# Patient Record
Sex: Female | Born: 1970 | Race: White | Hispanic: No | Marital: Single | Smoking: Never smoker
Health system: Southern US, Community
[De-identification: ages and names within clinical notes are randomized; demographics above are authoritative.]

## PROBLEM LIST (undated history)

## (undated) DIAGNOSIS — J45909 Unspecified asthma, uncomplicated: Secondary | ICD-10-CM

## (undated) DIAGNOSIS — G43909 Migraine, unspecified, not intractable, without status migrainosus: Secondary | ICD-10-CM

## (undated) HISTORY — PX: CERVICAL FUSION: SHX112

## (undated) HISTORY — PX: BREAST SURGERY: SHX581

---

## 2008-03-29 ENCOUNTER — Ambulatory Visit: Payer: Self-pay | Admitting: Family Medicine

## 2008-03-29 DIAGNOSIS — J209 Acute bronchitis, unspecified: Secondary | ICD-10-CM

## 2008-03-29 DIAGNOSIS — J45901 Unspecified asthma with (acute) exacerbation: Secondary | ICD-10-CM | POA: Insufficient documentation

## 2017-11-07 ENCOUNTER — Other Ambulatory Visit: Payer: Self-pay

## 2017-11-07 ENCOUNTER — Emergency Department (INDEPENDENT_AMBULATORY_CARE_PROVIDER_SITE_OTHER): Payer: 59

## 2017-11-07 ENCOUNTER — Emergency Department (INDEPENDENT_AMBULATORY_CARE_PROVIDER_SITE_OTHER)
Admission: EM | Admit: 2017-11-07 | Discharge: 2017-11-07 | Disposition: A | Discharge status: Home / Self Care | Payer: Self-pay | Source: Home / Self Care | Attending: Family Medicine | Admitting: Family Medicine

## 2017-11-07 DIAGNOSIS — Z981 Arthrodesis status: Secondary | ICD-10-CM

## 2017-11-07 DIAGNOSIS — M542 Cervicalgia: Secondary | ICD-10-CM | POA: Diagnosis not present

## 2017-11-07 DIAGNOSIS — G8911 Acute pain due to trauma: Secondary | ICD-10-CM

## 2017-11-07 DIAGNOSIS — S139XXA Sprain of joints and ligaments of unspecified parts of neck, initial encounter: Secondary | ICD-10-CM

## 2017-11-07 HISTORY — DX: Migraine, unspecified, not intractable, without status migrainosus: G43.909

## 2017-11-07 HISTORY — DX: Unspecified asthma, uncomplicated: J45.909

## 2017-11-07 MED ORDER — KETOROLAC TROMETHAMINE 60 MG/2ML IM SOLN
60.0000 mg | Freq: Once | INTRAMUSCULAR | Status: AC
  Administered 2017-11-07: 60 mg via INTRAMUSCULAR

## 2017-11-07 MED ORDER — HYDROCODONE-ACETAMINOPHEN 5-325 MG PO TABS: 1.0000 | ORAL_TABLET | Freq: Four times a day (QID) | ORAL | 0 refills | Status: AC | PRN

## 2017-11-07 NOTE — Discharge Instructions (Signed)
Apply ice pack for 20 to 30 minutes, 3 to 4 times daily  Continue until pain and swelling decrease. May continue Ibuprofen 200mg , 4 tabs every 8 hours with food.  Begin range of motion and stretching exercises in about five days.
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
## 2018-12-06 DIAGNOSIS — E7800 Pure hypercholesterolemia, unspecified: Secondary | ICD-10-CM | POA: Diagnosis not present

## 2018-12-06 DIAGNOSIS — Z0000 Encounter for general adult medical examination without abnormal findings: Secondary | ICD-10-CM | POA: Diagnosis not present

## 2018-12-06 DIAGNOSIS — J45909 Unspecified asthma, uncomplicated: Secondary | ICD-10-CM | POA: Diagnosis not present

## 2018-12-06 DIAGNOSIS — G43019 Migraine without aura, intractable, without status migrainosus: Secondary | ICD-10-CM | POA: Diagnosis not present

## 2019-01-03 DIAGNOSIS — Z20828 Contact with and (suspected) exposure to other viral communicable diseases: Secondary | ICD-10-CM | POA: Diagnosis not present

## 2019-01-04 DIAGNOSIS — Z1159 Encounter for screening for other viral diseases: Secondary | ICD-10-CM | POA: Diagnosis not present

## 2019-01-23 DIAGNOSIS — Z888 Allergy status to other drugs, medicaments and biological substances status: Secondary | ICD-10-CM | POA: Diagnosis not present

## 2019-01-23 DIAGNOSIS — Z7951 Long term (current) use of inhaled steroids: Secondary | ICD-10-CM | POA: Diagnosis not present

## 2019-01-23 DIAGNOSIS — J45909 Unspecified asthma, uncomplicated: Secondary | ICD-10-CM | POA: Diagnosis not present

## 2019-01-23 DIAGNOSIS — Z8619 Personal history of other infectious and parasitic diseases: Secondary | ICD-10-CM | POA: Diagnosis not present

## 2019-01-23 DIAGNOSIS — J9811 Atelectasis: Secondary | ICD-10-CM | POA: Diagnosis not present

## 2019-01-23 DIAGNOSIS — R0602 Shortness of breath: Secondary | ICD-10-CM | POA: Diagnosis not present

## 2019-01-23 DIAGNOSIS — Z882 Allergy status to sulfonamides status: Secondary | ICD-10-CM | POA: Diagnosis not present

## 2019-01-23 DIAGNOSIS — Z887 Allergy status to serum and vaccine status: Secondary | ICD-10-CM | POA: Diagnosis not present

## 2019-01-23 DIAGNOSIS — Z79899 Other long term (current) drug therapy: Secondary | ICD-10-CM | POA: Diagnosis not present

## 2019-01-23 DIAGNOSIS — J45901 Unspecified asthma with (acute) exacerbation: Secondary | ICD-10-CM | POA: Diagnosis not present

## 2019-07-05 DIAGNOSIS — J301 Allergic rhinitis due to pollen: Secondary | ICD-10-CM | POA: Diagnosis not present

## 2019-07-05 DIAGNOSIS — J4550 Severe persistent asthma, uncomplicated: Secondary | ICD-10-CM | POA: Diagnosis not present

## 2019-07-19 DIAGNOSIS — J4550 Severe persistent asthma, uncomplicated: Secondary | ICD-10-CM | POA: Diagnosis not present

## 2019-07-19 DIAGNOSIS — Z20822 Contact with and (suspected) exposure to covid-19: Secondary | ICD-10-CM | POA: Diagnosis not present

## 2019-07-19 DIAGNOSIS — Z01812 Encounter for preprocedural laboratory examination: Secondary | ICD-10-CM | POA: Diagnosis not present

## 2019-07-27 DIAGNOSIS — R062 Wheezing: Secondary | ICD-10-CM | POA: Diagnosis not present

## 2019-07-27 DIAGNOSIS — R0609 Other forms of dyspnea: Secondary | ICD-10-CM | POA: Diagnosis not present

## 2019-07-30 DIAGNOSIS — J4550 Severe persistent asthma, uncomplicated: Secondary | ICD-10-CM | POA: Diagnosis not present

## 2019-12-04 DIAGNOSIS — Z124 Encounter for screening for malignant neoplasm of cervix: Secondary | ICD-10-CM | POA: Diagnosis not present

## 2020-01-22 DIAGNOSIS — R87619 Unspecified abnormal cytological findings in specimens from cervix uteri: Secondary | ICD-10-CM | POA: Diagnosis not present

## 2020-01-22 DIAGNOSIS — Z01812 Encounter for preprocedural laboratory examination: Secondary | ICD-10-CM | POA: Diagnosis not present

## 2020-01-22 DIAGNOSIS — N870 Mild cervical dysplasia: Secondary | ICD-10-CM | POA: Diagnosis not present

## 2020-04-03 DIAGNOSIS — R6889 Other general symptoms and signs: Secondary | ICD-10-CM | POA: Diagnosis not present

## 2020-04-03 DIAGNOSIS — Z7185 Encounter for immunization safety counseling: Secondary | ICD-10-CM | POA: Diagnosis not present

## 2020-04-03 DIAGNOSIS — Z8616 Personal history of COVID-19: Secondary | ICD-10-CM | POA: Diagnosis not present

## 2020-04-03 DIAGNOSIS — Z79899 Other long term (current) drug therapy: Secondary | ICD-10-CM | POA: Diagnosis not present

## 2020-04-03 DIAGNOSIS — J4550 Severe persistent asthma, uncomplicated: Secondary | ICD-10-CM | POA: Diagnosis not present

## 2020-04-03 DIAGNOSIS — R490 Dysphonia: Secondary | ICD-10-CM | POA: Diagnosis not present

## 2020-04-03 DIAGNOSIS — Z7951 Long term (current) use of inhaled steroids: Secondary | ICD-10-CM | POA: Diagnosis not present

## 2020-04-03 DIAGNOSIS — Z92241 Personal history of systemic steroid therapy: Secondary | ICD-10-CM | POA: Diagnosis not present

## 2020-04-09 DIAGNOSIS — J4550 Severe persistent asthma, uncomplicated: Secondary | ICD-10-CM | POA: Diagnosis not present

## 2020-04-09 DIAGNOSIS — K219 Gastro-esophageal reflux disease without esophagitis: Secondary | ICD-10-CM | POA: Diagnosis not present

## 2020-04-09 DIAGNOSIS — B3789 Other sites of candidiasis: Secondary | ICD-10-CM | POA: Diagnosis not present

## 2020-04-09 DIAGNOSIS — Z8616 Personal history of COVID-19: Secondary | ICD-10-CM | POA: Diagnosis not present

## 2020-04-09 DIAGNOSIS — J384 Edema of larynx: Secondary | ICD-10-CM | POA: Diagnosis not present

## 2020-04-09 DIAGNOSIS — J387 Other diseases of larynx: Secondary | ICD-10-CM | POA: Diagnosis not present

## 2020-04-09 DIAGNOSIS — R490 Dysphonia: Secondary | ICD-10-CM | POA: Diagnosis not present

## 2020-05-08 DIAGNOSIS — N9489 Other specified conditions associated with female genital organs and menstrual cycle: Secondary | ICD-10-CM | POA: Diagnosis not present

## 2020-05-08 DIAGNOSIS — Z30433 Encounter for removal and reinsertion of intrauterine contraceptive device: Secondary | ICD-10-CM | POA: Diagnosis not present

## 2020-05-15 DIAGNOSIS — R6889 Other general symptoms and signs: Secondary | ICD-10-CM | POA: Diagnosis not present

## 2020-05-15 DIAGNOSIS — R490 Dysphonia: Secondary | ICD-10-CM | POA: Diagnosis not present

## 2020-05-15 DIAGNOSIS — J384 Edema of larynx: Secondary | ICD-10-CM | POA: Diagnosis not present

## 2020-07-11 DIAGNOSIS — Z0000 Encounter for general adult medical examination without abnormal findings: Secondary | ICD-10-CM | POA: Diagnosis not present

## 2020-07-11 DIAGNOSIS — N390 Urinary tract infection, site not specified: Secondary | ICD-10-CM | POA: Diagnosis not present

## 2020-07-11 DIAGNOSIS — E7800 Pure hypercholesterolemia, unspecified: Secondary | ICD-10-CM | POA: Diagnosis not present

## 2020-07-11 DIAGNOSIS — R946 Abnormal results of thyroid function studies: Secondary | ICD-10-CM | POA: Diagnosis not present

## 2020-07-11 DIAGNOSIS — R7309 Other abnormal glucose: Secondary | ICD-10-CM | POA: Diagnosis not present

## 2020-07-11 DIAGNOSIS — Z5181 Encounter for therapeutic drug level monitoring: Secondary | ICD-10-CM | POA: Diagnosis not present

## 2020-07-11 DIAGNOSIS — E559 Vitamin D deficiency, unspecified: Secondary | ICD-10-CM | POA: Diagnosis not present

## 2020-07-14 DIAGNOSIS — K219 Gastro-esophageal reflux disease without esophagitis: Secondary | ICD-10-CM | POA: Diagnosis not present

## 2020-07-14 DIAGNOSIS — G4700 Insomnia, unspecified: Secondary | ICD-10-CM | POA: Diagnosis not present

## 2020-07-14 DIAGNOSIS — Z0000 Encounter for general adult medical examination without abnormal findings: Secondary | ICD-10-CM | POA: Diagnosis not present

## 2020-07-14 DIAGNOSIS — J384 Edema of larynx: Secondary | ICD-10-CM | POA: Diagnosis not present

## 2020-07-14 DIAGNOSIS — J45909 Unspecified asthma, uncomplicated: Secondary | ICD-10-CM | POA: Diagnosis not present

## 2020-07-14 DIAGNOSIS — Z8616 Personal history of COVID-19: Secondary | ICD-10-CM | POA: Diagnosis not present

## 2020-07-14 DIAGNOSIS — L309 Dermatitis, unspecified: Secondary | ICD-10-CM | POA: Diagnosis not present

## 2020-07-14 DIAGNOSIS — J383 Other diseases of vocal cords: Secondary | ICD-10-CM | POA: Diagnosis not present

## 2020-07-14 DIAGNOSIS — G43019 Migraine without aura, intractable, without status migrainosus: Secondary | ICD-10-CM | POA: Diagnosis not present

## 2020-07-14 DIAGNOSIS — J387 Other diseases of larynx: Secondary | ICD-10-CM | POA: Diagnosis not present

## 2020-07-14 DIAGNOSIS — E7800 Pure hypercholesterolemia, unspecified: Secondary | ICD-10-CM | POA: Diagnosis not present

## 2020-07-14 DIAGNOSIS — R7989 Other specified abnormal findings of blood chemistry: Secondary | ICD-10-CM | POA: Diagnosis not present

## 2020-07-14 DIAGNOSIS — R7309 Other abnormal glucose: Secondary | ICD-10-CM | POA: Diagnosis not present

## 2020-09-01 DIAGNOSIS — D225 Melanocytic nevi of trunk: Secondary | ICD-10-CM | POA: Diagnosis not present

## 2020-09-01 DIAGNOSIS — L821 Other seborrheic keratosis: Secondary | ICD-10-CM | POA: Diagnosis not present

## 2020-09-01 DIAGNOSIS — L2089 Other atopic dermatitis: Secondary | ICD-10-CM | POA: Diagnosis not present

## 2020-10-04 DIAGNOSIS — Z1231 Encounter for screening mammogram for malignant neoplasm of breast: Secondary | ICD-10-CM | POA: Diagnosis not present

## 2020-10-10 DIAGNOSIS — R5383 Other fatigue: Secondary | ICD-10-CM | POA: Diagnosis not present

## 2020-10-10 DIAGNOSIS — N951 Menopausal and female climacteric states: Secondary | ICD-10-CM | POA: Diagnosis not present

## 2020-10-10 DIAGNOSIS — R7309 Other abnormal glucose: Secondary | ICD-10-CM | POA: Diagnosis not present

## 2020-10-10 DIAGNOSIS — E7800 Pure hypercholesterolemia, unspecified: Secondary | ICD-10-CM | POA: Diagnosis not present

## 2020-10-10 DIAGNOSIS — R635 Abnormal weight gain: Secondary | ICD-10-CM | POA: Diagnosis not present

## 2020-10-15 DIAGNOSIS — N951 Menopausal and female climacteric states: Secondary | ICD-10-CM | POA: Diagnosis not present

## 2020-10-15 DIAGNOSIS — R635 Abnormal weight gain: Secondary | ICD-10-CM | POA: Diagnosis not present

## 2020-10-15 DIAGNOSIS — Z1339 Encounter for screening examination for other mental health and behavioral disorders: Secondary | ICD-10-CM | POA: Diagnosis not present

## 2020-10-15 DIAGNOSIS — Z1331 Encounter for screening for depression: Secondary | ICD-10-CM | POA: Diagnosis not present

## 2020-10-15 DIAGNOSIS — E785 Hyperlipidemia, unspecified: Secondary | ICD-10-CM | POA: Diagnosis not present

## 2020-10-15 DIAGNOSIS — M2550 Pain in unspecified joint: Secondary | ICD-10-CM | POA: Diagnosis not present

## 2020-10-15 DIAGNOSIS — Z6825 Body mass index (BMI) 25.0-25.9, adult: Secondary | ICD-10-CM | POA: Diagnosis not present

## 2020-10-15 DIAGNOSIS — K760 Fatty (change of) liver, not elsewhere classified: Secondary | ICD-10-CM | POA: Diagnosis not present

## 2020-10-31 DIAGNOSIS — R69 Illness, unspecified: Secondary | ICD-10-CM | POA: Diagnosis not present

## 2020-10-31 DIAGNOSIS — J45909 Unspecified asthma, uncomplicated: Secondary | ICD-10-CM | POA: Diagnosis not present

## 2020-10-31 DIAGNOSIS — E7800 Pure hypercholesterolemia, unspecified: Secondary | ICD-10-CM | POA: Diagnosis not present

## 2020-10-31 DIAGNOSIS — R635 Abnormal weight gain: Secondary | ICD-10-CM | POA: Diagnosis not present

## 2020-10-31 DIAGNOSIS — R7303 Prediabetes: Secondary | ICD-10-CM | POA: Diagnosis not present

## 2021-04-17 DIAGNOSIS — R635 Abnormal weight gain: Secondary | ICD-10-CM | POA: Diagnosis not present

## 2021-04-17 DIAGNOSIS — Z0289 Encounter for other administrative examinations: Secondary | ICD-10-CM | POA: Diagnosis not present

## 2021-04-17 DIAGNOSIS — G43019 Migraine without aura, intractable, without status migrainosus: Secondary | ICD-10-CM | POA: Diagnosis not present

## 2021-04-17 DIAGNOSIS — R7303 Prediabetes: Secondary | ICD-10-CM | POA: Diagnosis not present

## 2021-04-17 DIAGNOSIS — G4700 Insomnia, unspecified: Secondary | ICD-10-CM | POA: Diagnosis not present

## 2021-04-17 DIAGNOSIS — E7800 Pure hypercholesterolemia, unspecified: Secondary | ICD-10-CM | POA: Diagnosis not present

## 2021-04-23 DIAGNOSIS — J40 Bronchitis, not specified as acute or chronic: Secondary | ICD-10-CM | POA: Diagnosis not present

## 2021-04-23 DIAGNOSIS — R509 Fever, unspecified: Secondary | ICD-10-CM | POA: Diagnosis not present

## 2021-04-23 DIAGNOSIS — R519 Headache, unspecified: Secondary | ICD-10-CM | POA: Diagnosis not present

## 2021-04-23 DIAGNOSIS — Z20822 Contact with and (suspected) exposure to covid-19: Secondary | ICD-10-CM | POA: Diagnosis not present

## 2021-04-23 DIAGNOSIS — R059 Cough, unspecified: Secondary | ICD-10-CM | POA: Diagnosis not present

## 2021-04-23 DIAGNOSIS — J029 Acute pharyngitis, unspecified: Secondary | ICD-10-CM | POA: Diagnosis not present

## 2021-04-28 DIAGNOSIS — J45998 Other asthma: Secondary | ICD-10-CM | POA: Diagnosis not present

## 2021-07-02 DIAGNOSIS — E663 Overweight: Secondary | ICD-10-CM | POA: Diagnosis not present

## 2021-07-02 DIAGNOSIS — J45909 Unspecified asthma, uncomplicated: Secondary | ICD-10-CM | POA: Diagnosis not present

## 2021-07-02 DIAGNOSIS — G43019 Migraine without aura, intractable, without status migrainosus: Secondary | ICD-10-CM | POA: Diagnosis not present

## 2021-07-10 DIAGNOSIS — G4700 Insomnia, unspecified: Secondary | ICD-10-CM | POA: Diagnosis not present

## 2021-07-10 DIAGNOSIS — R635 Abnormal weight gain: Secondary | ICD-10-CM | POA: Diagnosis not present

## 2021-07-10 DIAGNOSIS — E663 Overweight: Secondary | ICD-10-CM | POA: Diagnosis not present

## 2021-07-10 DIAGNOSIS — E7800 Pure hypercholesterolemia, unspecified: Secondary | ICD-10-CM | POA: Diagnosis not present

## 2021-07-10 DIAGNOSIS — R7309 Other abnormal glucose: Secondary | ICD-10-CM | POA: Diagnosis not present

## 2021-07-10 DIAGNOSIS — R7303 Prediabetes: Secondary | ICD-10-CM | POA: Diagnosis not present

## 2021-07-10 DIAGNOSIS — Z0000 Encounter for general adult medical examination without abnormal findings: Secondary | ICD-10-CM | POA: Diagnosis not present

## 2021-07-10 DIAGNOSIS — R7989 Other specified abnormal findings of blood chemistry: Secondary | ICD-10-CM | POA: Diagnosis not present

## 2021-07-10 DIAGNOSIS — E559 Vitamin D deficiency, unspecified: Secondary | ICD-10-CM | POA: Diagnosis not present

## 2021-07-27 DIAGNOSIS — R69 Illness, unspecified: Secondary | ICD-10-CM | POA: Diagnosis not present

## 2021-07-27 DIAGNOSIS — E663 Overweight: Secondary | ICD-10-CM | POA: Diagnosis not present

## 2021-07-27 DIAGNOSIS — J4540 Moderate persistent asthma, uncomplicated: Secondary | ICD-10-CM | POA: Diagnosis not present

## 2021-07-27 DIAGNOSIS — G43019 Migraine without aura, intractable, without status migrainosus: Secondary | ICD-10-CM | POA: Diagnosis not present

## 2021-08-26 DIAGNOSIS — Z124 Encounter for screening for malignant neoplasm of cervix: Secondary | ICD-10-CM | POA: Diagnosis not present

## 2021-08-26 DIAGNOSIS — R87619 Unspecified abnormal cytological findings in specimens from cervix uteri: Secondary | ICD-10-CM | POA: Diagnosis not present

## 2021-08-27 DIAGNOSIS — J4540 Moderate persistent asthma, uncomplicated: Secondary | ICD-10-CM | POA: Diagnosis not present

## 2021-08-27 DIAGNOSIS — R69 Illness, unspecified: Secondary | ICD-10-CM | POA: Diagnosis not present

## 2021-08-27 DIAGNOSIS — E663 Overweight: Secondary | ICD-10-CM | POA: Diagnosis not present

## 2021-08-27 DIAGNOSIS — G43019 Migraine without aura, intractable, without status migrainosus: Secondary | ICD-10-CM | POA: Diagnosis not present

## 2021-08-27 DIAGNOSIS — E7800 Pure hypercholesterolemia, unspecified: Secondary | ICD-10-CM | POA: Diagnosis not present

## 2021-08-28 ENCOUNTER — Other Ambulatory Visit: Payer: Self-pay | Admitting: Family Medicine

## 2021-08-28 DIAGNOSIS — E7800 Pure hypercholesterolemia, unspecified: Secondary | ICD-10-CM

## 2021-09-04 ENCOUNTER — Ambulatory Visit
Admission: RE | Admit: 2021-09-04 | Discharge: 2021-09-04 | Disposition: A | Discharge status: Home / Self Care | Payer: No Typology Code available for payment source | Source: Ambulatory Visit | Attending: Family Medicine | Admitting: Family Medicine

## 2021-09-04 DIAGNOSIS — E7800 Pure hypercholesterolemia, unspecified: Secondary | ICD-10-CM

## 2021-11-09 DIAGNOSIS — R195 Other fecal abnormalities: Secondary | ICD-10-CM | POA: Diagnosis not present

## 2021-11-09 DIAGNOSIS — G4700 Insomnia, unspecified: Secondary | ICD-10-CM | POA: Diagnosis not present

## 2021-11-09 DIAGNOSIS — R110 Nausea: Secondary | ICD-10-CM | POA: Diagnosis not present

## 2021-11-09 DIAGNOSIS — R1011 Right upper quadrant pain: Secondary | ICD-10-CM | POA: Diagnosis not present

## 2021-11-09 DIAGNOSIS — R1084 Generalized abdominal pain: Secondary | ICD-10-CM | POA: Diagnosis not present

## 2021-11-09 DIAGNOSIS — R519 Headache, unspecified: Secondary | ICD-10-CM | POA: Diagnosis not present

## 2021-11-12 DIAGNOSIS — R1011 Right upper quadrant pain: Secondary | ICD-10-CM | POA: Diagnosis not present

## 2021-11-12 DIAGNOSIS — N3000 Acute cystitis without hematuria: Secondary | ICD-10-CM | POA: Diagnosis not present

## 2021-11-12 DIAGNOSIS — K8020 Calculus of gallbladder without cholecystitis without obstruction: Secondary | ICD-10-CM | POA: Diagnosis not present

## 2022-06-16 ENCOUNTER — Ambulatory Visit (INDEPENDENT_AMBULATORY_CARE_PROVIDER_SITE_OTHER): Payer: No Typology Code available for payment source

## 2022-06-16 ENCOUNTER — Ambulatory Visit: Payer: No Typology Code available for payment source | Admitting: Sports Medicine

## 2022-06-16 DIAGNOSIS — M5136 Other intervertebral disc degeneration, lumbar region: Secondary | ICD-10-CM | POA: Insufficient documentation

## 2022-06-16 DIAGNOSIS — M51369 Other intervertebral disc degeneration, lumbar region without mention of lumbar back pain or lower extremity pain: Secondary | ICD-10-CM | POA: Insufficient documentation

## 2022-06-16 DIAGNOSIS — M549 Dorsalgia, unspecified: Secondary | ICD-10-CM | POA: Diagnosis not present

## 2022-06-16 MED ORDER — TRAMADOL HCL 50 MG PO TABS: 50.0000 mg | ORAL_TABLET | Freq: Three times a day (TID) | ORAL | 0 refills | Status: DC | PRN

## 2022-06-16 MED ORDER — METHYLPREDNISOLONE 4 MG PO TBPK: ORAL_TABLET | ORAL | 0 refills | Status: AC

## 2022-06-16 NOTE — Assessment & Plan Note (Signed)
Pleasant 52 year old female, several weeks of increasing axial low back pain worse with sitting, flexion, Valsalva, positive straight leg raise on the left, no red flag symptoms, x-rays, methylprednisolone that she does have a prednisone intolerance. Home physical therapy, tramadol. Return to see me in 6 weeks, MR for interventional planning if no better.

## 2022-06-16 NOTE — Progress Notes (Signed)
    Procedures performed today:    None.  Independent interpretation of notes and tests performed by another provider:   None.  Brief History, Exam, Impression, and Recommendations:    Lumbar degenerative disc disease Pleasant 52 year old female, several weeks of increasing axial low back pain worse with sitting, flexion, Valsalva, positive straight leg raise on the left, no red flag symptoms, x-rays, methylprednisolone that she does have a prednisone intolerance. Home physical therapy, tramadol. Return to see me in 6 weeks, MR for interventional planning if no better.    ____________________________________________ Gwen Her. Dianah Field, M.D., ABFM., CAQSM., AME. Primary Care and Sports Medicine [facility]  Adjunct Professor of [employer]  Risk manager

## 2022-06-24 ENCOUNTER — Other Ambulatory Visit (HOSPITAL_COMMUNITY): Payer: Self-pay

## 2022-06-24 MED ORDER — ZEPBOUND 2.5 MG/0.5ML ~~LOC~~ SOAJ
2.5000 mg | SUBCUTANEOUS | 2 refills | Status: AC
Start: 2022-06-24 — End: ?
  Filled 2022-06-24 – 2022-06-30 (×3): qty 2, 28d supply, fill #0
  Filled 2022-07-27: qty 2, 28d supply, fill #1
  Filled 2022-09-15: qty 2, 28d supply, fill #2

## 2022-06-25 ENCOUNTER — Other Ambulatory Visit: Payer: Self-pay

## 2022-06-30 ENCOUNTER — Other Ambulatory Visit (HOSPITAL_COMMUNITY): Payer: Self-pay

## 2022-06-30 ENCOUNTER — Other Ambulatory Visit: Payer: Self-pay

## 2022-07-05 ENCOUNTER — Encounter: Payer: Self-pay | Admitting: Sports Medicine

## 2022-07-05 DIAGNOSIS — M5136 Other intervertebral disc degeneration, lumbar region: Secondary | ICD-10-CM

## 2022-07-05 DIAGNOSIS — M51369 Other intervertebral disc degeneration, lumbar region without mention of lumbar back pain or lower extremity pain: Secondary | ICD-10-CM

## 2022-07-06 MED ORDER — TRAMADOL HCL 50 MG PO TABS: 50.0000 mg | ORAL_TABLET | Freq: Three times a day (TID) | ORAL | 0 refills | Status: DC | PRN

## 2022-07-12 NOTE — Therapy (Unsigned)
OUTPATIENT PHYSICAL THERAPY THORACOLUMBAR EVALUATION   Patient Name: Sheena Yang MRN: [id number] DOB:[birth year], 52 y.o., female Today's Date: 07/13/2022  END OF SESSION:  PT End of Session - 07/13/22 1147     Visit Number 1    Number of Visits 16    Date for PT Re-Evaluation 09/07/22    Authorization Type [employer]    PT Start Time 1147    PT Stop Time 1230    PT Time Calculation (min) 43 min    Activity Tolerance Patient tolerated treatment well    Behavior During Therapy [facility] for tasks assessed/performed             Past Medical History:  Diagnosis Date   Asthma    Migraines    Past Surgical History:  Procedure Laterality Date   BREAST SURGERY     reduction 2009   CERVICAL FUSION     Patient Active Problem List   Diagnosis Date Noted   Lumbar degenerative disc disease 06/16/2022   BRONCHITIS, ACUTE WITH BRONCHOSPASM 03/29/2008   ASTHMA, ACUTE 03/29/2008    PCP: Jani Gravel MD  REFERRING PROVIDER: Silverio Decamp, MD  REFERRING DIAG: M51.36 (ICD-10-CM) - Lumbar degenerative disc disease  Rationale for Evaluation and Treatment: Rehabilitation  THERAPY DIAG:  Other low back pain  Muscle weakness (generalized)  Abnormal posture  ONSET DATE: ~1 month ago flare up but likely multiple months  SUBJECTIVE:                                                                                                                                                                                           SUBJECTIVE STATEMENT: Pt states she saw Dr. Darene Lamer a month ago for her back. Pt reports she was having trouble walking. Unable to reach her feet. Pt states she was told she has a herniated disc. Pt has been doing exercises given to her by Dr T. Pt does a hot yoga class -- every time after she notes she gets a lot of pain. Was starting to feel better with some rest (has tried to sit more throughout the day) but after going back to regular activities she hurts again.    PERTINENT HISTORY:  Reports history of cervical fusion  PAIN:  Are you having pain? Yes: NPRS scale: Currently 6, at worst 9/10 Pain location: low mid back Pain description: Just a few times going down R leg, dull/ache Aggravating factors: 1.5 hours of standing, sitting long periods (worst) Relieving factors: Rest  PRECAUTIONS: None  WEIGHT BEARING RESTRICTIONS: No  FALLS:  Has patient fallen in last 6 months? No  LIVING ENVIRONMENT: Lives with:  lives with their family 2 kids Lives in: House/apartment Stairs:  Has following equipment at home: None  OCCUPATION: Stands all day with standing desk  PLOF: Independent  PATIENT GOALS: Improve pain with all activities  NEXT MD VISIT: n/a  OBJECTIVE:   DIAGNOSTIC FINDINGS:  Lumbar x-ray 06/17/22: IMPRESSION: Negative.  PATIENT SURVEYS:  FOTO 20; predicted 15  SCREENING FOR RED FLAGS: Bowel or bladder incontinence: No Spinal tumors: No Cauda equina syndrome: No Compression fracture: No Abdominal aneurysm: No  COGNITION: Overall cognitive status: Within functional limits for tasks assessed     SENSATION: WFL  MUSCLE LENGTH: Hamstrings: Right 80 deg; Left 70 deg Thomas test: Right -10 deg; Left 0 deg  POSTURE: No Significant postural limitations  PALPATION: Mostly TTP in lower lumbar paraspinals. Tender with Sacral Pas.   LUMBAR ROM:   AROM eval  Flexion 30% *  Extension 80%  Right lateral flexion 2" above knee *  Left lateral flexion 2" above knee *  Right rotation 90%  Left rotation 90%   (Blank rows = not tested)  LOWER EXTREMITY ROM:   WFL  LOWER EXTREMITY MMT:    MMT Right eval Left eval  Hip flexion 4+ 4+  Hip extension 4+ 4  Hip abduction 4 4-  Hip adduction    Hip internal rotation    Hip external rotation    Knee flexion 3+ 3+  Knee extension 5 5  Ankle dorsiflexion    Ankle plantarflexion    Ankle inversion    Ankle eversion     (Blank rows = not tested)  LUMBAR SPECIAL  TESTS:  Straight leg raise test: Positive, FABER test: Negative, and Thomas test: Negative  FUNCTIONAL TESTS:  Plank: to be assessed Squat: to be assessed  GAIT: Distance walked: Back of clinic Assistive device utilized: None Level of assistance: Complete Independence Comments: WFL  TODAY'S TREATMENT:                                                                                                                              DATE: 07/13/22 See HEP below    PATIENT EDUCATION:  Education details: Exam findings, postures to avoid, POC Person educated: Patient Education method: Explanation, Demonstration, and Handouts Education comprehension: verbalized understanding, returned demonstration, and needs further education  HOME EXERCISE PROGRAM: Access Code: [email or web address] URL: [email or web address] Date: 07/13/2022 Prepared by: Estill Bamberg April Thurnell Garbe  Exercises - Prone Press Up On Elbows  - 1 x daily - 7 x weekly - 1 sets - 10 reps - 3 sec hold - Supine Bridge  - 1 x daily - 7 x weekly - 2 sets - 10 reps - 3 sec hold - Bird Dog  - 1 x daily - 7 x weekly - 1 sets - 10 reps - 5 sec hold - Sit to Stand  - 1 x daily - 7 x weekly - 2 sets - 10 reps  ASSESSMENT:  CLINICAL IMPRESSION: Patient is a  52 y.o. F who was seen today for physical therapy evaluation and treatment for low back pain with insidious onset. Assessment significant for increased lumbar paraspinal hypertonicity, weak glutes and core, and limited lumbar ROM.  Pt would benefit from PT to address these issues to perform pain free ADLs and recreational activities.   OBJECTIVE IMPAIRMENTS: decreased activity tolerance, decreased mobility, difficulty walking, decreased ROM, decreased strength, increased fascial restrictions, increased muscle spasms, impaired flexibility, improper body mechanics, and pain.   ACTIVITY LIMITATIONS: bending, sitting, standing, squatting, dressing, and hygiene/grooming  PARTICIPATION  LIMITATIONS: driving and occupation  PERSONAL FACTORS: Fitness, Past/current experiences, and Time since onset of injury/illness/exacerbation are also affecting patient's functional outcome.   REHAB POTENTIAL: Good  CLINICAL DECISION MAKING: Evolving/moderate complexity  EVALUATION COMPLEXITY: Moderate   GOALS: Goals reviewed with patient? Yes  SHORT TERM GOALS: Target date: 08/10/2022   Pt will be ind with initial HEP Baseline: Goal status: INITIAL  2.  Pt will have L = R SLR to demo improved hamstring extensibility for bending Baseline:  Goal status: INITIAL    LONG TERM GOALS: Target date: 09/07/2022   Pt will demo pain free lumbar ROM Baseline:  Goal status: INITIAL  2.  Pt will be able to stand and sit for >2 hours with pain </=2/10 Baseline:  Goal status: INITIAL  3.  Pt will be ind with progressing to community wellness activity/exercise  Baseline:  Goal status: INITIAL  4.  Pt will demo safe biomechanics for lifting at least 20# for cleaning tasks Baseline:  Goal status: INITIAL  5.  Pt will have increased FOTO score 62 Baseline:  Goal status: INITIAL  6.  Pt will be able to maintain a plank to at least 1 min to be within average norms for trunk stability Baseline:  Goal status: INITIAL  PLAN:  PT FREQUENCY: 2x/week  PT DURATION: 8 weeks  PLANNED INTERVENTIONS: Therapeutic exercises, Therapeutic activity, Neuromuscular re-education, Balance training, Gait training, Patient/Family education, Self Care, Joint mobilization, Stair training, Aquatic Therapy, Dry Needling, Electrical stimulation, Spinal mobilization, Cryotherapy, Moist heat, Taping, Traction, Ionotophoresis 4mg /ml Dexamethasone, and Manual therapy.  PLAN FOR NEXT SESSION: Assess response to HEP/extension program. Educate pt on body mechanics/ergonomics (print outs on HEP). Manual work as indicated for lumbar paraspinals. Strengthen core and hips.    Jalaine Riggenbach April Ma L Rosamond Andress,  PT 07/13/2022, 1:10 PM

## 2022-07-13 ENCOUNTER — Other Ambulatory Visit: Payer: Self-pay

## 2022-07-13 ENCOUNTER — Ambulatory Visit: Payer: No Typology Code available for payment source | Attending: Sports Medicine | Admitting: Physical Therapy

## 2022-07-13 DIAGNOSIS — M6281 Muscle weakness (generalized): Secondary | ICD-10-CM | POA: Insufficient documentation

## 2022-07-13 DIAGNOSIS — Z981 Arthrodesis status: Secondary | ICD-10-CM | POA: Insufficient documentation

## 2022-07-13 DIAGNOSIS — M5136 Other intervertebral disc degeneration, lumbar region: Secondary | ICD-10-CM | POA: Insufficient documentation

## 2022-07-13 DIAGNOSIS — M25612 Stiffness of left shoulder, not elsewhere classified: Secondary | ICD-10-CM | POA: Insufficient documentation

## 2022-07-13 DIAGNOSIS — R293 Abnormal posture: Secondary | ICD-10-CM | POA: Diagnosis not present

## 2022-07-13 DIAGNOSIS — M25512 Pain in left shoulder: Secondary | ICD-10-CM | POA: Diagnosis not present

## 2022-07-13 DIAGNOSIS — M5459 Other low back pain: Secondary | ICD-10-CM | POA: Insufficient documentation

## 2022-07-15 ENCOUNTER — Encounter: Payer: Self-pay | Admitting: Physical Therapy

## 2022-07-15 ENCOUNTER — Ambulatory Visit: Payer: No Typology Code available for payment source | Admitting: Physical Therapy

## 2022-07-15 DIAGNOSIS — M6281 Muscle weakness (generalized): Secondary | ICD-10-CM

## 2022-07-15 DIAGNOSIS — M25512 Pain in left shoulder: Secondary | ICD-10-CM | POA: Diagnosis not present

## 2022-07-15 DIAGNOSIS — M5459 Other low back pain: Secondary | ICD-10-CM

## 2022-07-15 DIAGNOSIS — R293 Abnormal posture: Secondary | ICD-10-CM

## 2022-07-15 NOTE — Therapy (Signed)
OUTPATIENT PHYSICAL THERAPY THORACOLUMBAR TREATMENT   Patient Name: Sheena Yang MRN: [id number] DOB:1971/03/18, 52 y.o., female Today's Date: 07/15/2022  END OF SESSION:  PT End of Session - 07/15/22 1102     Visit Number 2    Number of Visits 16    Date for PT Re-Evaluation 09/07/22    Authorization Type [employer]    PT Start Time 1102    PT Stop Time 1145    PT Time Calculation (min) 43 min    Activity Tolerance Patient tolerated treatment well    Behavior During Therapy [facility] for tasks assessed/performed             Past Medical History:  Diagnosis Date   Asthma    Migraines    Past Surgical History:  Procedure Laterality Date   BREAST SURGERY     reduction 2009   CERVICAL FUSION     Patient Active Problem List   Diagnosis Date Noted   Lumbar degenerative disc disease 06/16/2022   BRONCHITIS, ACUTE WITH BRONCHOSPASM 03/29/2008   ASTHMA, ACUTE 03/29/2008    PCP: Jani Gravel MD  REFERRING PROVIDER: Silverio Decamp, MD  REFERRING DIAG: M51.36 (ICD-10-CM) - Lumbar degenerative disc disease  Rationale for Evaluation and Treatment: Rehabilitation  THERAPY DIAG:  Other low back pain  Muscle weakness (generalized)  Abnormal posture  ONSET DATE: ~1 month ago flare up but likely multiple months  SUBJECTIVE:                                                                                                                                                                                           SUBJECTIVE STATEMENT: Pt reports some soreness after evaluation. Pt states she did try some of the exercises yesterday.   PERTINENT HISTORY:  Reports history of cervical fusion From eval: Pt states she saw Dr. Darene Lamer a month ago for her back. Pt reports she was having trouble walking. Unable to reach her feet. Pt states she was told she has a herniated disc. Pt has been doing exercises given to her by Dr T. Pt does a hot yoga class -- every time after she notes she  gets a lot of pain. Was starting to feel better with some rest (has tried to sit more throughout the day) but after going back to regular activities she hurts again.  PAIN:  Are you having pain? Yes: NPRS scale: Currently 6, at worst 9/10 Pain location: low mid back Pain description: Just a few times going down R leg, dull/ache Aggravating factors: 1.5 hours of standing, sitting long periods (worst) Relieving factors: Rest  PRECAUTIONS: None  WEIGHT BEARING  RESTRICTIONS: No  FALLS:  Has patient fallen in last 6 months? No  LIVING ENVIRONMENT: Lives with: lives with their family 2 kids Lives in: House/apartment Stairs: n/a Has following equipment at home: None  OCCUPATION: Stands all day with standing desk  PLOF: Independent  PATIENT GOALS: Improve pain with all activities  NEXT MD VISIT: n/a  OBJECTIVE:   DIAGNOSTIC FINDINGS:  Lumbar x-ray 06/17/22: IMPRESSION: Negative.  PATIENT SURVEYS:  FOTO 8; predicted 64  SCREENING FOR RED FLAGS: Bowel or bladder incontinence: No Spinal tumors: No Cauda equina syndrome: No Compression fracture: No Abdominal aneurysm: No  COGNITION: Overall cognitive status: Within functional limits for tasks assessed     SENSATION: WFL  MUSCLE LENGTH: Hamstrings: Right 80 deg; Left 70 deg Thomas test: Right -10 deg; Left 0 deg  POSTURE: No Significant postural limitations  PALPATION: Mostly TTP in lower lumbar paraspinals. Tender with Sacral Pas.   LUMBAR ROM:   AROM eval  Flexion 30% *  Extension 80%  Right lateral flexion 2" above knee *  Left lateral flexion 2" above knee *  Right rotation 90%  Left rotation 90%   (Blank rows = not tested)  LOWER EXTREMITY ROM:   WFL  LOWER EXTREMITY MMT:    MMT Right eval Left eval  Hip flexion 4+ 4+  Hip extension 4+ 4  Hip abduction 4 4-  Hip adduction    Hip internal rotation    Hip external rotation    Knee flexion 3+ 3+  Knee extension 5 5  Ankle dorsiflexion     Ankle plantarflexion    Ankle inversion    Ankle eversion     (Blank rows = not tested)  LUMBAR SPECIAL TESTS:  Straight leg raise test: Positive, FABER test: Negative, and Thomas test: Negative  FUNCTIONAL TESTS:  Plank: on hands and toes x 1 min 15 sec(07/15/22)  Squat: to be assessed  GAIT: Distance walked: Back of clinic Assistive device utilized: None Level of assistance: Complete Independence Comments: WFL  TODAY'S TREATMENT:       [facility] Adult PT Treatment:                                                DATE: 07/15/22 Therapeutic Exercise: Nustep L6 x 5 min Prone press ups on forearm x10 Prone hamstring curl blue TB 2x10 Bird dog 2x10x3 sec Bridge 2x10x3 sec Neuromuscular re-ed: Prone hip ext x10 -- focusing on glutes and decreasing lumbar paraspinal overactivity Bird dog 2x10 focusing on core activation without trunk rotation Manual therapy: STM & TPR lumbar paraspinals, UT and supraspinatus                                                                                                                          DATE: 07/13/22 See HEP below    PATIENT EDUCATION:  Education  details: Exam findings, TPDN, HEP updates Person educated: Patient Education method: Explanation, Demonstration, and Handouts Education comprehension: verbalized understanding, returned demonstration, and needs further education  HOME EXERCISE PROGRAM: Access Code: [email or web address] URL: [email or web address] Date: 07/15/2022 Prepared by: Estill Bamberg April Thurnell Garbe  Exercises - Prone Press Up On Elbows  - 1 x daily - 7 x weekly - 1 sets - 10 reps - 3 sec hold - Supine Bridge  - 1 x daily - 7 x weekly - 2 sets - 10 reps - 3 sec hold - Bird Dog  - 1 x daily - 7 x weekly - 1 sets - 10 reps - 5 sec hold - Sit to Stand  - 1 x daily - 7 x weekly - 2 sets - 10 reps - Prone Hamstring Curl with Anchored Resistance  - 1 x daily - 7 x weekly - 2 sets - 10 reps - 3 sec hold - Prone Hip Extension  - 1  x daily - 7 x weekly - 1 sets - 10 reps  Patient Education - Trigger Point Dry Needling  ASSESSMENT:  CLINICAL IMPRESSION: Session focused on working on core, glute/hamstring strength and activation without lumbar paraspinal overactivity.   OBJECTIVE IMPAIRMENTS: decreased activity tolerance, decreased mobility, difficulty walking, decreased ROM, decreased strength, increased fascial restrictions, increased muscle spasms, impaired flexibility, improper body mechanics, and pain.   ACTIVITY LIMITATIONS: bending, sitting, standing, squatting, dressing, and hygiene/grooming  PARTICIPATION LIMITATIONS: driving and occupation  PERSONAL FACTORS: Fitness, Past/current experiences, and Time since onset of injury/illness/exacerbation are also affecting patient's functional outcome.   REHAB POTENTIAL: Good  CLINICAL DECISION MAKING: Evolving/moderate complexity  EVALUATION COMPLEXITY: Moderate   GOALS: Goals reviewed with patient? Yes  SHORT TERM GOALS: Target date: 08/10/2022   Pt will be ind with initial HEP Baseline: Goal status: INITIAL  2.  Pt will have L = R SLR to demo improved hamstring extensibility for bending Baseline:  Goal status: INITIAL    LONG TERM GOALS: Target date: 09/07/2022   Pt will demo pain free lumbar ROM Baseline:  Goal status: INITIAL  2.  Pt will be able to stand and sit for >2 hours with pain </=2/10 Baseline:  Goal status: INITIAL  3.  Pt will be ind with progressing to community wellness activity/exercise  Baseline:  Goal status: INITIAL  4.  Pt will demo safe biomechanics for lifting at least 20# for cleaning tasks Baseline:  Goal status: INITIAL  5.  Pt will have increased FOTO score 62 Baseline:  Goal status: INITIAL  6.  Pt will be able to maintain a plank to at least 1 min to be within average norms for trunk stability Baseline:  Goal status: INITIAL  PLAN:  PT FREQUENCY: 2x/week  PT DURATION: 8 weeks  PLANNED  INTERVENTIONS: Therapeutic exercises, Therapeutic activity, Neuromuscular re-education, Balance training, Gait training, Patient/Family education, Self Care, Joint mobilization, Stair training, Aquatic Therapy, Dry Needling, Electrical stimulation, Spinal mobilization, Cryotherapy, Moist heat, Taping, Traction, Ionotophoresis 4mg /ml Dexamethasone, and Manual therapy.  PLAN FOR NEXT SESSION: Assess response to HEP/extension program. Educate pt on body mechanics/ergonomics (print outs on HEP). Manual work as indicated for lumbar paraspinals. Strengthen core and hips.    Yoni Lobos April Ma L Wilkes Potvin, PT 07/15/2022, 11:02 AM

## 2022-07-20 ENCOUNTER — Encounter: Payer: Self-pay | Admitting: Physical Therapy

## 2022-07-20 ENCOUNTER — Ambulatory Visit: Payer: No Typology Code available for payment source | Admitting: Physical Therapy

## 2022-07-20 DIAGNOSIS — M6281 Muscle weakness (generalized): Secondary | ICD-10-CM

## 2022-07-20 DIAGNOSIS — M5459 Other low back pain: Secondary | ICD-10-CM

## 2022-07-20 DIAGNOSIS — M25512 Pain in left shoulder: Secondary | ICD-10-CM | POA: Diagnosis not present

## 2022-07-20 DIAGNOSIS — R293 Abnormal posture: Secondary | ICD-10-CM

## 2022-07-20 NOTE — Therapy (Signed)
OUTPATIENT PHYSICAL THERAPY THORACOLUMBAR TREATMENT   Patient Name: Sheena Yang MRN: [id number] DOB:1971/01/11, 52 y.o., female Today's Date: 07/20/2022  END OF SESSION:  PT End of Session - 07/20/22 1519     Visit Number 3    Number of Visits 16    Date for PT Re-Evaluation 09/07/22    Authorization Type [employer]    PT Start Time 1525    PT Stop Time 1605    PT Time Calculation (min) 40 min    Activity Tolerance Patient tolerated treatment well    Behavior During Therapy [facility] for tasks assessed/performed              Past Medical History:  Diagnosis Date   Asthma    Migraines    Past Surgical History:  Procedure Laterality Date   BREAST SURGERY     reduction 2009   CERVICAL FUSION     Patient Active Problem List   Diagnosis Date Noted   Lumbar degenerative disc disease 06/16/2022   BRONCHITIS, ACUTE WITH BRONCHOSPASM 03/29/2008   ASTHMA, ACUTE 03/29/2008    PCP: Jani Gravel MD  REFERRING PROVIDER: Silverio Decamp, MD  REFERRING DIAG: M51.36 (ICD-10-CM) - Lumbar degenerative disc disease  Rationale for Evaluation and Treatment: Rehabilitation  THERAPY DIAG:  Other low back pain  Muscle weakness (generalized)  Abnormal posture  ONSET DATE: ~1 month ago flare up but likely multiple months  SUBJECTIVE:                                                                                                                                                                                           SUBJECTIVE STATEMENT: Pt reports she did a lot of hiking this weekend -- was in a lot of pain yesterday. Pt states she did > 3 miles of hiking with a lot of up hill. Pt reports she is still a little sore but better than yesterday. Pt reports her L shoulder was spasming on Saturday. Pt states she was able to stand for longer on Friday.   PERTINENT HISTORY:  Reports history of cervical fusion From eval: Pt states she saw Dr. Darene Lamer a month ago for her back. Pt  reports she was having trouble walking. Unable to reach her feet. Pt states she was told she has a herniated disc. Pt has been doing exercises given to her by Dr T. Pt does a hot yoga class -- every time after she notes she gets a lot of pain. Was starting to feel better with some rest (has tried to sit more throughout the day) but after going back to regular activities she hurts again.  PAIN:  Are you having pain? Yes: NPRS scale: Currently 4, at worst 9/10 Pain location: low mid back Pain description: Just a few times going down R leg, dull/ache Aggravating factors: 1.5 hours of standing, sitting long periods (worst) Relieving factors: Rest  PRECAUTIONS: None  WEIGHT BEARING RESTRICTIONS: No  FALLS:  Has patient fallen in last 6 months? No  LIVING ENVIRONMENT: Lives with: lives with their family 2 kids Lives in: House/apartment Stairs: n/a Has following equipment at home: None  OCCUPATION: Stands all day with standing desk  PLOF: Independent  PATIENT GOALS: Improve pain with all activities  NEXT MD VISIT: n/a  OBJECTIVE: (Measures in this section from initial evaluation unless otherwise noted)  DIAGNOSTIC FINDINGS:  Lumbar x-ray 06/17/22: IMPRESSION: Negative.  PATIENT SURVEYS:  FOTO 47; predicted 62  MUSCLE LENGTH: Hamstrings: Right 80 deg; Left 70 deg Thomas test: Right -10 deg; Left 0 deg  PALPATION: Mostly TTP in lower lumbar paraspinals. Tender with Sacral Pas.   LUMBAR ROM:   AROM eval  Flexion 30% *  Extension 80%  Right lateral flexion 2" above knee *  Left lateral flexion 2" above knee *  Right rotation 90%  Left rotation 90%   (Blank rows = not tested)  LOWER EXTREMITY MMT:    MMT Right eval Left eval  Hip flexion 4+ 4+  Hip extension 4+ 4  Hip abduction 4 4-  Hip adduction    Hip internal rotation    Hip external rotation    Knee flexion 3+ 3+  Knee extension 5 5  Ankle dorsiflexion    Ankle plantarflexion    Ankle inversion     Ankle eversion     (Blank rows = not tested)  LUMBAR SPECIAL TESTS:  Straight leg raise test: Positive, FABER test: Negative, and Thomas test: Negative  FUNCTIONAL TESTS:  Plank: on hands and toes x 1 min 15 sec(07/15/22)  Squat: to be assessed  TODAY'S TREATMENT:       [facility] Adult PT Treatment:                                                DATE: 07/20/22 Therapeutic Exercise: Nustep L6 x 5 min UEs/LEs Sitting hip flexor stretch x 30 sec LTR x30 sec R&L Supine hamstring stretch with strap x30 sec Child's pose x30 sec, with lateral flexion x30 sec Bird dog 10x3 sec Prone hip ext x10 PPT with wall squat x10 Manual Therapy: STM & TPR bilat lumbar paraspinals and QL Skilled assessment and palpation for TPDN Trigger Point Dry-Needling  Treatment instructions: Expect mild to moderate muscle soreness. S/S of pneumothorax if dry needled over a lung field, and to seek immediate medical attention should they occur. Patient verbalized understanding of these instructions and education.  Patient Consent Given: Yes Education handout provided: Yes Muscles treated: bilat lumbar paraspinals, L QL Electrical stimulation performed: No Parameters: N/A Treatment response/outcome: Twitch response, palpable decrease in muscle tone Modalities: TENS x 15 min with moist heat on lumbar paraspinals   [facility] Adult PT Treatment:                                                DATE: 07/15/22 Therapeutic Exercise: Nustep L6 x 5 min  Prone press ups on forearm x10 Prone hamstring curl blue TB 2x10 Bird dog 2x10x3 sec Bridge 2x10x3 sec Neuromuscular re-ed: Prone hip ext x10 -- focusing on glutes and decreasing lumbar paraspinal overactivity Bird dog 2x10 focusing on core activation without trunk rotation Manual therapy: STM & TPR lumbar paraspinals, UT and supraspinatus                                                                                                                          DATE: 07/13/22 See  HEP below    PATIENT EDUCATION:  Education details: Exam findings, TPDN, HEP updates Person educated: Patient Education method: Explanation, Demonstration, and Handouts Education comprehension: verbalized understanding, returned demonstration, and needs further education  HOME EXERCISE PROGRAM: Access Code: [email or web address] URL: [email or web address] Date: 07/20/2022 Prepared by: Estill Bamberg April Thurnell Garbe  Exercises - Prone Press Up On Elbows  - 1 x daily - 7 x weekly - 1 sets - 10 reps - 3 sec hold - Supine Bridge  - 1 x daily - 7 x weekly - 2 sets - 10 reps - 3 sec hold - Bird Dog  - 1 x daily - 7 x weekly - 1 sets - 10 reps - 5 sec hold - Sit to Stand  - 1 x daily - 7 x weekly - 2 sets - 10 reps - Prone Hamstring Curl with Anchored Resistance  - 1 x daily - 7 x weekly - 2 sets - 10 reps - 3 sec hold - Prone Hip Extension  - 1 x daily - 7 x weekly - 1 sets - 10 reps - Wall Slide with Posterior Pelvic Tilt  - 1 x daily - 7 x weekly - 2 sets - 10 reps - 3 sec hold  ASSESSMENT:  CLINICAL IMPRESSION: Performed trial of TPDN this session along lumbar paraspinals and L QL. Continued work on improving hamstring length. Discussed standing work Personal assistant (found pt to be slightly rotated to the L with keyboard placement). Continued glute strengthening without paraspinal overactivity.    GOALS: Goals reviewed with patient? Yes  SHORT TERM GOALS: Target date: 08/10/2022   Pt will be ind with initial HEP Baseline: Goal status: INITIAL  2.  Pt will have L = R SLR to demo improved hamstring extensibility for bending Baseline:  Goal status: INITIAL    LONG TERM GOALS: Target date: 09/07/2022   Pt will demo pain free lumbar ROM Baseline:  Goal status: INITIAL  2.  Pt will be able to stand and sit for >2 hours with pain </=2/10 Baseline:  Goal status: INITIAL  3.  Pt will be ind with progressing to community wellness activity/exercise  Baseline:  Goal status:  INITIAL  4.  Pt will demo safe biomechanics for lifting at least 20# for cleaning tasks Baseline:  Goal status: INITIAL  5.  Pt will have increased FOTO score 62 Baseline:  Goal status: INITIAL  6.  Pt will be able to maintain a plank to  at least 1 min to be within average norms for trunk stability Baseline:  Goal status: INITIAL  PLAN:  PT FREQUENCY: 2x/week  PT DURATION: 8 weeks  PLANNED INTERVENTIONS: Therapeutic exercises, Therapeutic activity, Neuromuscular re-education, Balance training, Gait training, Patient/Family education, Self Care, Joint mobilization, Stair training, Aquatic Therapy, Dry Needling, Electrical stimulation, Spinal mobilization, Cryotherapy, Moist heat, Taping, Traction, Ionotophoresis 61m/ml Dexamethasone, and Manual therapy.  PLAN FOR NEXT SESSION: Assess response to HEP/extension program. Educate pt on body mechanics/ergonomics (print outs on HEP). Manual work as indicated for lumbar paraspinals. Strengthen core and hips.    Cherita Hebel April Ma L Giannie Soliday, PT 07/20/2022, 3:19 PM

## 2022-07-22 ENCOUNTER — Ambulatory Visit: Payer: No Typology Code available for payment source | Admitting: Physical Therapy

## 2022-07-22 ENCOUNTER — Encounter: Payer: Self-pay | Admitting: Physical Therapy

## 2022-07-22 DIAGNOSIS — R293 Abnormal posture: Secondary | ICD-10-CM

## 2022-07-22 DIAGNOSIS — M25512 Pain in left shoulder: Secondary | ICD-10-CM | POA: Diagnosis not present

## 2022-07-22 DIAGNOSIS — M5459 Other low back pain: Secondary | ICD-10-CM

## 2022-07-22 DIAGNOSIS — M6281 Muscle weakness (generalized): Secondary | ICD-10-CM

## 2022-07-22 NOTE — Therapy (Signed)
OUTPATIENT PHYSICAL THERAPY THORACOLUMBAR TREATMENT   Patient Name: Sheena Yang MRN: [id number] DOB:1971/03/28, 52 y.o., female Today's Date: 07/22/2022  END OF SESSION:  PT End of Session - 07/22/22 0759     Visit Number 4    Number of Visits 16    Date for PT Re-Evaluation 09/07/22    Authorization Type Aetna    PT Start Time 0800    PT Stop Time 0845    PT Time Calculation (min) 45 min    Activity Tolerance Patient tolerated treatment well    Behavior During Therapy [facility] for tasks assessed/performed               Past Medical History:  Diagnosis Date   Asthma    Migraines    Past Surgical History:  Procedure Laterality Date   BREAST SURGERY     reduction 2009   CERVICAL FUSION     Patient Active Problem List   Diagnosis Date Noted   Lumbar degenerative disc disease 06/16/2022   BRONCHITIS, ACUTE WITH BRONCHOSPASM 03/29/2008   ASTHMA, ACUTE 03/29/2008    PCP: Jani Gravel MD  REFERRING PROVIDER: Silverio Decamp, MD  REFERRING DIAG: M51.36 (ICD-10-CM) - Lumbar degenerative disc disease  Rationale for Evaluation and Treatment: Rehabilitation  THERAPY DIAG:  Other low back pain  Muscle weakness (generalized)  Abnormal posture  ONSET DATE: ~1 month ago flare up but likely multiple months  SUBJECTIVE:                                                                                                                                                                                           SUBJECTIVE STATEMENT: Pt states she was sore after needling up until last night  PERTINENT HISTORY:  Reports history of cervical fusion From eval: Pt states she saw Dr. Darene Lamer a month ago for her back. Pt reports she was having trouble walking. Unable to reach her feet. Pt states she was told she has a herniated disc. Pt has been doing exercises given to her by Dr T. Pt does a hot yoga class -- every time after she notes she gets a lot of pain. Was starting to  feel better with some rest (has tried to sit more throughout the day) but after going back to regular activities she hurts again.  PAIN:  Are you having pain? Yes: NPRS scale: Currently 5, at worst 9/10 Pain location: low mid back Pain description: Just a few times going down R leg, dull/ache Aggravating factors: 1.5 hours of standing, sitting long periods (worst) Relieving factors: Rest  PRECAUTIONS: None  WEIGHT BEARING RESTRICTIONS: No  FALLS:  Has patient fallen in last 6 months? No  LIVING ENVIRONMENT: Lives with: lives with their family 2 kids Lives in: House/apartment Stairs: n/a Has following equipment at home: None  OCCUPATION: Stands all day with standing desk  PLOF: Independent  PATIENT GOALS: Improve pain with all activities  NEXT MD VISIT: n/a  OBJECTIVE: (Measures in this section from initial evaluation unless otherwise noted)  DIAGNOSTIC FINDINGS:  Lumbar x-ray 06/17/22: IMPRESSION: Negative.  PATIENT SURVEYS:  FOTO 47; predicted 62  MUSCLE LENGTH: Hamstrings: Right 80 deg; Left 70 deg Thomas test: Right -10 deg; Left 0 deg  PALPATION: Mostly TTP in lower lumbar paraspinals. Tender with Sacral Pas.   LUMBAR ROM:   AROM eval  Flexion 30% *  Extension 80%  Right lateral flexion 2" above knee *  Left lateral flexion 2" above knee *  Right rotation 90%  Left rotation 90%   (Blank rows = not tested)  LOWER EXTREMITY MMT:    MMT Right eval Left eval  Hip flexion 4+ 4+  Hip extension 4+ 4  Hip abduction 4 4-  Hip adduction    Hip internal rotation    Hip external rotation    Knee flexion 3+ 3+  Knee extension 5 5  Ankle dorsiflexion    Ankle plantarflexion    Ankle inversion    Ankle eversion     (Blank rows = not tested)  LUMBAR SPECIAL TESTS:  Straight leg raise test: Positive, FABER test: Negative, and Thomas test: Negative  FUNCTIONAL TESTS:  Plank: on hands and toes x 1 min 15 sec(07/15/22)  Squat: to be assessed  TODAY'S  TREATMENT:       [facility] Adult PT Treatment:                                                DATE: 07/22/22 Therapeutic Exercise: Sitting pball lumbar flexion 10x10 sec, with lateral flexion x20 sec R&L Prone hip ext x10 Prone press up 2x30 sec Forearm plank 2x30 sec Sitting hamstring stretch 2x30 sec R&L Wall slides with PPT 2x10x3 sec Shoulder ext green TB 10x5 sec, blue TB 10x5 sec Row reactive iso blue TB 10x5 sec Palloff press red TB doubled 2x10x5 sec Manual Therapy: STM & TPR bilat lumbar paraspinals and QL PROM to stretch hip flexors in prone    [facility] Adult PT Treatment:                                                DATE: 07/20/22 Therapeutic Exercise: Nustep L6 x 5 min UEs/LEs Sitting hip flexor stretch x 30 sec LTR x30 sec R&L Supine hamstring stretch with strap x30 sec Child's pose x30 sec, with lateral flexion x30 sec Bird dog 10x3 sec Prone hip ext x10 PPT with wall squat x10 Manual Therapy: STM & TPR bilat lumbar paraspinals and QL Skilled assessment and palpation for TPDN Trigger Point Dry-Needling  Treatment instructions: Expect mild to moderate muscle soreness. S/S of pneumothorax if dry needled over a lung field, and to seek immediate medical attention should they occur. Patient verbalized understanding of these instructions and education.  Patient Consent Given: Yes Education handout provided: Yes Muscles treated: bilat lumbar paraspinals, L QL Electrical stimulation performed: No Parameters: N/A  Treatment response/outcome: Twitch response, palpable decrease in muscle tone Modalities: TENS x 15 min with moist heat on lumbar paraspinals                                                                                                                          DATE: 07/13/22 See HEP below    PATIENT EDUCATION:  Education details: Exam findings, TPDN, HEP updates Person educated: Patient Education method: Explanation, Demonstration, and Handouts Education  comprehension: verbalized understanding, returned demonstration, and needs further education  HOME EXERCISE PROGRAM: Access Code: [email or web address] URL: [email or web address] Date: 07/20/2022 Prepared by: Estill Bamberg April Thurnell Garbe  Exercises - Prone Press Up On Elbows  - 1 x daily - 7 x weekly - 1 sets - 10 reps - 3 sec hold - Supine Bridge  - 1 x daily - 7 x weekly - 2 sets - 10 reps - 3 sec hold - Bird Dog  - 1 x daily - 7 x weekly - 1 sets - 10 reps - 5 sec hold - Sit to Stand  - 1 x daily - 7 x weekly - 2 sets - 10 reps - Prone Hamstring Curl with Anchored Resistance  - 1 x daily - 7 x weekly - 2 sets - 10 reps - 3 sec hold - Prone Hip Extension  - 1 x daily - 7 x weekly - 1 sets - 10 reps - Wall Slide with Posterior Pelvic Tilt  - 1 x daily - 7 x weekly - 2 sets - 10 reps - 3 sec hold  ASSESSMENT:  CLINICAL IMPRESSION: Pt still slightly sore from TPDN -- will defer another round of TPDN this session. Continued to progress pt to standing core strengthening.    GOALS: Goals reviewed with patient? Yes  SHORT TERM GOALS: Target date: 08/10/2022   Pt will be ind with initial HEP Baseline: Goal status: INITIAL  2.  Pt will have L = R SLR to demo improved hamstring extensibility for bending Baseline:  Goal status: INITIAL    LONG TERM GOALS: Target date: 09/07/2022   Pt will demo pain free lumbar ROM Baseline:  Goal status: INITIAL  2.  Pt will be able to stand and sit for >2 hours with pain </=2/10 Baseline:  Goal status: INITIAL  3.  Pt will be ind with progressing to community wellness activity/exercise  Baseline:  Goal status: INITIAL  4.  Pt will demo safe biomechanics for lifting at least 20# for cleaning tasks Baseline:  Goal status: INITIAL  5.  Pt will have increased FOTO score 62 Baseline:  Goal status: INITIAL  6.  Pt will be able to maintain a plank to at least 1 min to be within average norms for trunk stability Baseline:  Goal status:  INITIAL  PLAN:  PT FREQUENCY: 2x/week  PT DURATION: 8 weeks  PLANNED INTERVENTIONS: Therapeutic exercises, Therapeutic activity, Neuromuscular re-education, Balance training, Gait training, Patient/Family education, Self Care, Joint mobilization, Stair training, Aquatic Therapy,  Dry Needling, Electrical stimulation, Spinal mobilization, Cryotherapy, Moist heat, Taping, Traction, Ionotophoresis 85m/ml Dexamethasone, and Manual therapy.  PLAN FOR NEXT SESSION: Assess response to HEP/extension program. Manual work as indicated for lumbar paraspinals. Strengthen core and hips.    Brooke Payes April Ma L Jamelia Varano, PT 07/22/2022, 8:00 AM

## 2022-07-27 ENCOUNTER — Ambulatory Visit: Payer: No Typology Code available for payment source

## 2022-07-27 ENCOUNTER — Encounter: Payer: Self-pay | Admitting: Sports Medicine

## 2022-07-27 ENCOUNTER — Other Ambulatory Visit: Payer: Self-pay

## 2022-07-27 ENCOUNTER — Ambulatory Visit: Payer: No Typology Code available for payment source | Admitting: Sports Medicine

## 2022-07-27 ENCOUNTER — Ambulatory Visit (INDEPENDENT_AMBULATORY_CARE_PROVIDER_SITE_OTHER): Payer: No Typology Code available for payment source

## 2022-07-27 DIAGNOSIS — M5136 Other intervertebral disc degeneration, lumbar region: Secondary | ICD-10-CM

## 2022-07-27 DIAGNOSIS — Z981 Arthrodesis status: Secondary | ICD-10-CM

## 2022-07-27 DIAGNOSIS — R202 Paresthesia of skin: Secondary | ICD-10-CM | POA: Diagnosis not present

## 2022-07-27 DIAGNOSIS — G5603 Carpal tunnel syndrome, bilateral upper limbs: Secondary | ICD-10-CM

## 2022-07-27 DIAGNOSIS — M51369 Other intervertebral disc degeneration, lumbar region without mention of lumbar back pain or lower extremity pain: Secondary | ICD-10-CM

## 2022-07-27 MED ORDER — TRAMADOL HCL 50 MG PO TABS: 50.0000 mg | ORAL_TABLET | Freq: Two times a day (BID) | ORAL | 3 refills | Status: DC | PRN

## 2022-07-27 NOTE — Assessment & Plan Note (Signed)
Persistent axial back pain with radiation into the buttock and thighs bilaterally, she has failed greater than 6 weeks conservative treatment including steroids and formal PT. X-rays unrevealing, proceeding with MRI for epidural planning.

## 2022-07-27 NOTE — Assessment & Plan Note (Signed)
Pleasant 52 year old female, bilateral carpal tunnel syndrome, bilateral hand numbness and tingling most at night, she does wear splints, insufficient improvement. Today we did a right median nerve hydrodissection, return to see me as needed for the left side.

## 2022-07-27 NOTE — Progress Notes (Signed)
    Procedures performed today:    Procedure: Real-time Ultrasound Guided hydrodissection of the right median nerve at the carpal tunnel Device: Samsung HS60 Verbal informed consent obtained.  Time-out conducted.  Noted no overlying erythema, induration, or other signs of local infection.  Skin prepped in a sterile fashion.  Local anesthesia: Topical Ethyl chloride.  With sterile technique and under real time ultrasound guidance: Noted enlarged median nerve, using a 25-gauge needle advanced into the carpal tunnel, taking care to avoid intraneural injection I injected medication both superficial to and deep to the median nerve freeing it from surrounding structures, I then redirected the needle deep and injected further medication around the flexor tendons deep within the carpal tunnel for a total of 1 cc kenalog 40, 5 cc 1% lidocaine without epinephrine. Completed without difficulty  Advised to call if fevers/chills, erythema, induration, drainage, or persistent bleeding.  Images permanently stored and available for review in PACS.  Impression: Technically successful ultrasound guided median nerve hydrodissection.  Independent interpretation of notes and tests performed by another provider:   None.  Brief History, Exam, Impression, and Recommendations:    Carpal tunnel syndrome, bilateral Pleasant 52 year old female, bilateral carpal tunnel syndrome, bilateral hand numbness and tingling most at night, she does wear splints, insufficient improvement. Today we did a right median nerve hydrodissection, return to see me as needed for the left side.  Lumbar degenerative disc disease Persistent axial back pain with radiation into the buttock and thighs bilaterally, she has failed greater than 6 weeks conservative treatment including steroids and formal PT. X-rays unrevealing, proceeding with MRI for epidural planning.  S/P cervical spinal fusion History of cervical fusion, adding x-rays,  refilling tramadol, adding neck PT.    ____________________________________________ Gwen Her. Dianah Field, M.D., ABFM., CAQSM., AME. Primary Care and Sports Medicine [facility]  Adjunct Professor of [employer]  Risk manager

## 2022-07-27 NOTE — Assessment & Plan Note (Signed)
History of cervical fusion, adding x-rays, refilling tramadol, adding neck PT.

## 2022-07-28 ENCOUNTER — Telehealth: Payer: Self-pay

## 2022-07-28 ENCOUNTER — Encounter: Payer: Self-pay | Admitting: Sports Medicine

## 2022-07-28 DIAGNOSIS — M5136 Other intervertebral disc degeneration, lumbar region: Secondary | ICD-10-CM

## 2022-07-28 DIAGNOSIS — M51369 Other intervertebral disc degeneration, lumbar region without mention of lumbar back pain or lower extremity pain: Secondary | ICD-10-CM

## 2022-07-28 NOTE — Telephone Encounter (Addendum)
Initiated Prior authorization [id number] HCl '50MG'$  tablets Via: Covermymeds Case/Key:[id number]  Status: approved  as of 07/28/22 Reason:Authorization Expiration Date: 01/24/2023  Notified Pt via: Mychart

## 2022-07-29 ENCOUNTER — Ambulatory Visit: Payer: No Typology Code available for payment source | Admitting: Physical Therapy

## 2022-07-29 ENCOUNTER — Encounter: Payer: Self-pay | Admitting: Physical Therapy

## 2022-07-29 DIAGNOSIS — M25512 Pain in left shoulder: Secondary | ICD-10-CM | POA: Diagnosis not present

## 2022-07-29 DIAGNOSIS — R293 Abnormal posture: Secondary | ICD-10-CM

## 2022-07-29 DIAGNOSIS — M5459 Other low back pain: Secondary | ICD-10-CM

## 2022-07-29 DIAGNOSIS — M6281 Muscle weakness (generalized): Secondary | ICD-10-CM

## 2022-07-29 DIAGNOSIS — M25612 Stiffness of left shoulder, not elsewhere classified: Secondary | ICD-10-CM

## 2022-07-29 MED ORDER — TRIAZOLAM 0.25 MG PO TABS: ORAL_TABLET | ORAL | 0 refills | Status: AC

## 2022-07-29 NOTE — Therapy (Signed)
OUTPATIENT PHYSICAL THERAPY THORACOLUMBAR TREATMENT AND CERVICAL ASSESSMENT   Patient Name: Sheena Yang MRN: [id number] DOB:07/01/1970, 52 y.o., female Today's Date: 07/29/2022  END OF SESSION:  PT End of Session - 07/29/22 1015     Visit Number 5    Number of Visits 16    Date for PT Re-Evaluation 09/07/22    Authorization Type Aetna    PT Start Time 1015    PT Stop Time 1100    PT Time Calculation (min) 45 min    Activity Tolerance Patient tolerated treatment well    Behavior During Therapy [facility] for tasks assessed/performed               Past Medical History:  Diagnosis Date   Asthma    Migraines    Past Surgical History:  Procedure Laterality Date   BREAST SURGERY     reduction 2009   CERVICAL FUSION     Patient Active Problem List   Diagnosis Date Noted   Carpal tunnel syndrome, bilateral 07/27/2022   S/P cervical spinal fusion 07/27/2022   Lumbar degenerative disc disease 06/16/2022   BRONCHITIS, ACUTE WITH BRONCHOSPASM 03/29/2008   ASTHMA, ACUTE 03/29/2008    PCP: Sheena Yang  REFERRING PROVIDER: Silverio Decamp, Yang  REFERRING DIAG: M51.36 (ICD-10-CM) - Lumbar degenerative disc disease  Rationale for Evaluation and Treatment: Rehabilitation  THERAPY DIAG:  Other low back pain  Muscle weakness (generalized)  Abnormal posture  Left shoulder pain, unspecified chronicity  Stiffness of left shoulder, not elsewhere classified  ONSET DATE: ~1 month ago flare up but likely multiple months  SUBJECTIVE:                                                                                                                                                                                           SUBJECTIVE STATEMENT: Pt reports she felt good after massage. Continued feelings of tightness in low back. Did not feel that TPDN improved her muscle tension/pain. Felt sore after last treatment but did not note any increase or exacerbation in her  back pain. Has continued to get random spasms in her neck.   PERTINENT HISTORY:  Reports history of cervical fusion From eval: Pt states she saw Dr. Darene Yang a month ago for her back. Pt reports she was having trouble walking. Unable to reach her feet. Pt states she was told she has a herniated disc. Pt has been doing exercises given to her by Dr T. Pt does a hot yoga class -- every time after she notes she gets a lot of pain. Was starting to feel better with some rest (has tried  to sit more throughout the day) but after going back to regular activities she hurts again.  PAIN:  Are you having pain? Yes: NPRS scale: Currently 5, at worst 9/10 Pain location: low mid back Pain description: Just a few times going down R leg, dull/ache Aggravating factors: 1.5 hours of standing, sitting long periods (worst) Relieving factors: Rest  PRECAUTIONS: None  WEIGHT BEARING RESTRICTIONS: No  FALLS:  Has patient fallen in last 6 months? No  LIVING ENVIRONMENT: Lives with: lives with their family 2 kids Lives in: House/apartment Stairs: n/a Has following equipment at home: None  OCCUPATION: Stands all day with standing desk  PLOF: Independent  PATIENT GOALS: Improve pain with all activities  NEXT Yang VISIT: n/a  OBJECTIVE: (Measures in this section from initial evaluation unless otherwise noted)  DIAGNOSTIC FINDINGS:  Lumbar x-ray 06/17/22: IMPRESSION: Negative.  PATIENT SURVEYS:  FOTO 47; predicted 62  MUSCLE LENGTH: Hamstrings: Right 80 deg; Left 70 deg Thomas test: Right -10 deg; Left 0 deg  PALPATION: Mostly TTP in lower lumbar paraspinals. Tender with Sacral Pas.   LUMBAR ROM:   AROM eval  Flexion 30% *  Extension 80%  Right lateral flexion 2" above knee *  Left lateral flexion 2" above knee *  Right rotation 90%  Left rotation 90%   (Blank rows = not tested)  LOWER EXTREMITY MMT:    MMT Right eval Left eval  Hip flexion 4+ 4+  Hip extension 4+ 4  Hip abduction 4 4-   Hip adduction    Hip internal rotation    Hip external rotation    Knee flexion 3+ 3+  Knee extension 5 5  Ankle dorsiflexion    Ankle plantarflexion    Ankle inversion    Ankle eversion     (Blank rows = not tested)  UPPER EXTREMITY MMT:  MMT Right eval Left eval  Shoulder flexion 5 4+  Shoulder extension 5 5  Shoulder abduction 5 4+  Shoulder adduction    Shoulder extension 5 5  Shoulder internal rotation 5 5  Shoulder external rotation 5 4+  Middle trapezius 5 3+  Lower trapezius    Elbow flexion    Elbow extension    Wrist flexion    Wrist extension    Wrist ulnar deviation    Wrist radial deviation    Wrist pronation    Wrist supination    Grip strength     (Blank rows = not tested)   UPPER EXTREMITY ROM:  MMT Right eval Left eval  Shoulder flexion 160 145  Shoulder extension 50 48  Shoulder abduction 165 140*  Shoulder internal rotation T3 T5  Shoulder external rotation T3 T3   (Blank rows = not tested) * = pain  CERVICAL ROM:   Active ROM A/PROM (deg) eval  Flexion 30  Extension 45  Right lateral flexion 30  Left lateral flexion 30  Right rotation 40  Left rotation 40   (Blank rows = not tested)   LUMBAR SPECIAL TESTS:  Straight leg raise test: Positive, FABER test: Negative, and Thomas test: Negative  FUNCTIONAL TESTS:  Plank: on hands and toes x 1 min 15 sec(07/15/22)  Squat: to be assessed  TODAY'S TREATMENT:       [facility] Adult PT Treatment:  DATE: 07/29/22 Therapeutic Exercise: Supine hamstring stretch 2x30 sec Prone "W" x10  Prone hamstring curl red TB 2x10 Prone glute set with hip extended 10 deg 2x10x3 sec Standing horse stance x30 sec Standing fire hydrant red TB 2x10  Attempted Carma Leaven but pt unable to obtain good glute activity Sitting horizontal abd red TB 2x10 Standing shoulder IR reactive iso red TB x10 Manual Therapy: STM & TPR L low trap/mid trap Therapeutic  Activity: See cervical/UE assessment above   [facility] Adult PT Treatment:                                                DATE: 07/22/22 Therapeutic Exercise: Sitting pball lumbar flexion 10x10 sec, with lateral flexion x20 sec R&L Prone hip ext x10 Prone press up 2x30 sec Forearm plank 2x30 sec Sitting hamstring stretch 2x30 sec R&L Wall slides with PPT 2x10x3 sec Shoulder ext green TB 10x5 sec, blue TB 10x5 sec Row reactive iso blue TB 10x5 sec Palloff press red TB doubled 2x10x5 sec Manual Therapy: STM & TPR bilat lumbar paraspinals and QL PROM to stretch hip flexors in prone    [facility] Adult PT Treatment:                                                DATE: 07/20/22 Therapeutic Exercise: Nustep L6 x 5 min UEs/LEs Sitting hip flexor stretch x 30 sec LTR x30 sec R&L Supine hamstring stretch with strap x30 sec Child's pose x30 sec, with lateral flexion x30 sec Bird dog 10x3 sec Prone hip ext x10 PPT with wall squat x10 Manual Therapy: STM & TPR bilat lumbar paraspinals and QL Skilled assessment and palpation for TPDN Trigger Point Dry-Needling  Treatment instructions: Expect mild to moderate muscle soreness. S/S of pneumothorax if dry needled over a lung field, and to seek immediate medical attention should they occur. Patient verbalized understanding of these instructions and education.  Patient Consent Given: Yes Education handout provided: Yes Muscles treated: bilat lumbar paraspinals, L QL Electrical stimulation performed: No Parameters: N/A Treatment response/outcome: Twitch response, palpable decrease in muscle tone Modalities: TENS x 15 min with moist heat on lumbar paraspinals     PATIENT EDUCATION:  Education details: Exam findings, TPDN, HEP updates Person educated: Patient Education method: Explanation, Demonstration, and Handouts Education comprehension: verbalized understanding, returned demonstration, and needs further education  HOME EXERCISE  PROGRAM: Access Code: [email or web address] URL: [email or web address] Date: 07/29/2022 Prepared by: Estill Bamberg April Thurnell Garbe  Exercises - Prone Press Up On Elbows  - 1 x daily - 7 x weekly - 1 sets - 10 reps - 3 sec hold - Supine Bridge  - 1 x daily - 7 x weekly - 2 sets - 10 reps - 3 sec hold - Bird Dog  - 1 x daily - 7 x weekly - 1 sets - 10 reps - 5 sec hold - Sit to Stand  - 1 x daily - 7 x weekly - 2 sets - 10 reps - Prone Hamstring Curl with Anchored Resistance  - 1 x daily - 7 x weekly - 2 sets - 10 reps - 3 sec hold - Prone Hip Extension  - 1 x daily - 7 x weekly - 1  sets - 10 reps - Wall Slide with Posterior Pelvic Tilt  - 1 x daily - 7 x weekly - 2 sets - 10 reps - 3 sec hold - Standing Hip Abduction with Bent Knee  - 1 x daily - 7 x weekly - 3 sets - 10 reps - Prone W Scapular Retraction  - 1 x daily - 7 x weekly - 2 sets - 10 reps - Seated Shoulder Horizontal Abduction with Resistance  - 1 x daily - 7 x weekly - 2 sets - 10 reps - Isometric Standing Shoulder Internal Rotation - 90 Degrees Abduction  - 1 x daily - 7 x weekly - 2 sets - 10 reps  ASSESSMENT:  CLINICAL IMPRESSION: Session focused primarily on strengthening hamstrings and glutes. Worked on obtaining glute activity in standing to reduce lumbar strain. Discussed trialing different standing stances to bias more lower body/extremity support/work vs lumbar. Assessed pt's cervical spine and shoulder this session -- found L shoulder to be more limited than R with flexion, abd, and IR. Pt with weaker periscapular muscles on L compared to R. Trigger point in L mid/low trap with pt report of tingling/cramping sensation. Initiated strengthening exercises to begin to address this. Discussed dropping PT sessions to every other week due to finances.    GOALS: Goals reviewed with patient? Yes  SHORT TERM GOALS: Target date: 08/10/2022   Pt will be ind with initial HEP Baseline: Goal status: INITIAL  2.  Pt will have L = R  SLR to demo improved hamstring extensibility for bending Baseline:  Goal status: INITIAL    LONG TERM GOALS: Target date: 09/07/2022   Pt will demo pain free lumbar ROM Baseline:  Goal status: INITIAL  2.  Pt will be able to stand and sit for >2 hours with pain </=2/10 Baseline:  Goal status: INITIAL  3.  Pt will be ind with progressing to community wellness activity/exercise  Baseline:  Goal status: INITIAL  4.  Pt will demo safe biomechanics for lifting at least 20# for cleaning tasks Baseline:  Goal status: INITIAL  5.  Pt will have increased FOTO score 62 Baseline:  Goal status: INITIAL  6.  Pt will be able to maintain a plank to at least 1 min to be within average norms for trunk stability Baseline:  Goal status: INITIAL  7.  Pt will demo pain free L = R shoulder AROM for all overhead tasks Baseline:  Goal status: INITIAL   PLAN:  PT FREQUENCY: every other week  PT DURATION: 8 weeks  PLANNED INTERVENTIONS: Therapeutic exercises, Therapeutic activity, Neuromuscular re-education, Balance training, Gait training, Patient/Family education, Self Care, Joint mobilization, Stair training, Aquatic Therapy, Dry Needling, Electrical stimulation, Spinal mobilization, Cryotherapy, Moist heat, Taping, Traction, Ionotophoresis 71m/ml Dexamethasone, and Manual therapy.  PLAN FOR NEXT SESSION: Assess response to HEP/extension program. Manual work as indicated for lumbar paraspinals. Strengthen core and hips. Work on [place] core activation in standing. Continue to strengthen periscapular muscles/postural stabilizers.    Baylee Mccorkel April Ma L Linkyn Gobin, PT 07/29/2022, 11:25 AM

## 2022-08-01 ENCOUNTER — Ambulatory Visit (INDEPENDENT_AMBULATORY_CARE_PROVIDER_SITE_OTHER): Payer: No Typology Code available for payment source

## 2022-08-01 DIAGNOSIS — M545 Low back pain, unspecified: Secondary | ICD-10-CM

## 2022-08-01 DIAGNOSIS — M5136 Other intervertebral disc degeneration, lumbar region: Secondary | ICD-10-CM

## 2022-08-01 DIAGNOSIS — G8929 Other chronic pain: Secondary | ICD-10-CM | POA: Diagnosis not present

## 2022-08-03 ENCOUNTER — Other Ambulatory Visit: Payer: No Typology Code available for payment source

## 2022-08-08 NOTE — Addendum Note (Signed)
Addended by: Silverio Decamp on: 08/08/2022 09:40 PM   Modules accepted: Orders

## 2022-08-12 ENCOUNTER — Encounter: Payer: Self-pay | Admitting: Physical Therapy

## 2022-08-12 ENCOUNTER — Ambulatory Visit: Payer: No Typology Code available for payment source | Attending: Sports Medicine | Admitting: Physical Therapy

## 2022-08-12 DIAGNOSIS — M5459 Other low back pain: Secondary | ICD-10-CM | POA: Diagnosis present

## 2022-08-12 DIAGNOSIS — R293 Abnormal posture: Secondary | ICD-10-CM | POA: Diagnosis present

## 2022-08-12 DIAGNOSIS — M6281 Muscle weakness (generalized): Secondary | ICD-10-CM

## 2022-08-12 DIAGNOSIS — M25512 Pain in left shoulder: Secondary | ICD-10-CM | POA: Diagnosis present

## 2022-08-12 DIAGNOSIS — M25612 Stiffness of left shoulder, not elsewhere classified: Secondary | ICD-10-CM | POA: Diagnosis present

## 2022-08-12 NOTE — Therapy (Signed)
OUTPATIENT PHYSICAL THERAPY TREATMENT   Patient Name: Sheena Yang MRN: [id number] DOB:November 11, 1970, 52 y.o., female Today's Date: 08/12/2022  END OF SESSION:  PT End of Session - 08/12/22 1313     Visit Number 6    Number of Visits 16    Date for PT Re-Evaluation 09/07/22    Authorization Type [employer]    PT Start Time 1315    PT Stop Time 1400    PT Time Calculation (min) 45 min    Activity Tolerance Patient tolerated treatment well    Behavior During Therapy [facility] for tasks assessed/performed               Past Medical History:  Diagnosis Date   Asthma    Migraines    Past Surgical History:  Procedure Laterality Date   BREAST SURGERY     reduction 2009   CERVICAL FUSION     Patient Active Problem List   Diagnosis Date Noted   Carpal tunnel syndrome, bilateral 07/27/2022   S/P cervical spinal fusion 07/27/2022   Lumbar degenerative disc disease 06/16/2022   BRONCHITIS, ACUTE WITH BRONCHOSPASM 03/29/2008   ASTHMA, ACUTE 03/29/2008    PCP: Jani Gravel MD  REFERRING PROVIDER: Silverio Decamp, MD  REFERRING DIAG: M51.36 (ICD-10-CM) - Lumbar degenerative disc disease  Rationale for Evaluation and Treatment: Rehabilitation  THERAPY DIAG:  Other low back pain  Muscle weakness (generalized)  Abnormal posture  Left shoulder pain, unspecified chronicity  Stiffness of left shoulder, not elsewhere classified  ONSET DATE: ~1 month ago flare up but likely multiple months  SUBJECTIVE:                                                                                                                                                                                           SUBJECTIVE STATEMENT: Pt notes increased burning L midback pain with L rotation. Pt states she got a massage and noted increased UT. Pt does note she has been able to sit a little longer.  PERTINENT HISTORY:  Reports history of cervical fusion From eval: Pt states she saw Dr. Darene Lamer a month  ago for her back. Pt reports she was having trouble walking. Unable to reach her feet. Pt states she was told she has a herniated disc. Pt has been doing exercises given to her by Dr T. Pt does a hot yoga class -- every time after she notes she gets a lot of pain. Was starting to feel better with some rest (has tried to sit more throughout the day) but after going back to regular activities she hurts again.  PAIN:  Are you having  pain? Yes: NPRS scale: Currently 5, at worst 9/10 Pain location: low mid back Pain description: Just a few times going down R leg, dull/ache Aggravating factors: 1.5 hours of standing, sitting long periods (worst) Relieving factors: Rest  PRECAUTIONS: None  WEIGHT BEARING RESTRICTIONS: No  FALLS:  Has patient fallen in last 6 months? No  LIVING ENVIRONMENT: Lives with: lives with their family 2 kids Lives in: House/apartment Stairs: n/a Has following equipment at home: None  OCCUPATION: Stands all day with standing desk  PLOF: Independent  PATIENT GOALS: Improve pain with all activities  NEXT MD VISIT: n/a  OBJECTIVE: (Measures in this section from initial evaluation unless otherwise noted)  DIAGNOSTIC FINDINGS:  Lumbar x-ray 06/17/22: IMPRESSION: Negative.  PATIENT SURVEYS:  FOTO 47; predicted 62  MUSCLE LENGTH: Hamstrings: Right 80 deg; Left 70 deg Thomas test: Right -10 deg; Left 0 deg  PALPATION: Mostly TTP in lower lumbar paraspinals. Tender with Sacral Pas.   LUMBAR ROM:   AROM eval  Flexion 30% *  Extension 80%  Right lateral flexion 2" above knee *  Left lateral flexion 2" above knee *  Right rotation 90%  Left rotation 90%   (Blank rows = not tested)  LOWER EXTREMITY MMT:    MMT Right eval Left eval  Hip flexion 4+ 4+  Hip extension 4+ 4  Hip abduction 4 4-  Hip adduction    Hip internal rotation    Hip external rotation    Knee flexion 3+ 3+  Knee extension 5 5  Ankle dorsiflexion    Ankle plantarflexion     Ankle inversion    Ankle eversion     (Blank rows = not tested)  UPPER EXTREMITY MMT:  MMT Right eval Left eval  Shoulder flexion 5 4+  Shoulder extension 5 5  Shoulder abduction 5 4+  Shoulder adduction    Shoulder extension 5 5  Shoulder internal rotation 5 5  Shoulder external rotation 5 4+  Middle trapezius 5 3+  Lower trapezius    Elbow flexion    Elbow extension    Wrist flexion    Wrist extension    Wrist ulnar deviation    Wrist radial deviation    Wrist pronation    Wrist supination    Grip strength     (Blank rows = not tested)   UPPER EXTREMITY ROM:  MMT Right eval Left eval  Shoulder flexion 160 145  Shoulder extension 50 48  Shoulder abduction 165 140*  Shoulder internal rotation T3 T5  Shoulder external rotation T3 T3   (Blank rows = not tested) * = pain  CERVICAL ROM:   Active ROM A/PROM (deg) eval  Flexion 30  Extension 45  Right lateral flexion 30  Left lateral flexion 30  Right rotation 40  Left rotation 40   (Blank rows = not tested)   LUMBAR SPECIAL TESTS:  Straight leg raise test: Positive, FABER test: Negative, and Thomas test: Negative  FUNCTIONAL TESTS:  Plank: on hands and toes x 1 min 15 sec(07/15/22)  Squat: to be assessed  TODAY'S TREATMENT:       [facility] Adult PT Treatment:                                                DATE: 08/12/22 Therapeutic Exercise: Sidelying open/close book x10 R&L Prone "W" 2x10 Prone "  T" 2x10 Shoulder ER red TB reactive iso at 90 deg abd 2x10 Shoulder IR red TB reactive iso at 90 deg abd 2x10 Standing PPT x10 Table plank with PPT x10 Table plank + hip ext x10   [facility] Adult PT Treatment:                                                DATE: 07/29/22 Therapeutic Exercise: Supine hamstring stretch 2x30 sec Prone "W" x10  Prone hamstring curl red TB 2x10 Prone glute set with hip extended 10 deg 2x10x3 sec Standing horse stance x30 sec Standing fire hydrant red TB 2x10  Attempted Carma Leaven but pt unable to obtain good glute activity Sitting horizontal abd red TB 2x10 Standing shoulder IR reactive iso red TB x10 Manual Therapy: STM & TPR L low trap/mid trap Therapeutic Activity: See cervical/UE assessment above   [facility] Adult PT Treatment:                                                DATE: 07/22/22 Therapeutic Exercise: Sitting pball lumbar flexion 10x10 sec, with lateral flexion x20 sec R&L Prone hip ext x10 Prone press up 2x30 sec Forearm plank 2x30 sec Sitting hamstring stretch 2x30 sec R&L Wall slides with PPT 2x10x3 sec Shoulder ext green TB 10x5 sec, blue TB 10x5 sec Row reactive iso blue TB 10x5 sec Pall[place] press red TB doubled 2x10x5 sec Manual Therapy: STM & TPR bilat lumbar paraspinals and QL PROM to stretch hip flexors in prone   PATIENT EDUCATION:  Education details: Discussed standing postures to reduce load on low back Person educated: Patient Education method: Explanation, Demonstration, and Handouts Education comprehension: verbalized understanding, returned demonstration, and needs further education  HOME EXERCISE PROGRAM: Access Code: [email or web address] URL: [email or web address] Date: 07/29/2022 Prepared by: Estill Bamberg April Thurnell Garbe  Exercises - Prone Press Up On Elbows  - 1 x daily - 7 x weekly - 1 sets - 10 reps - 3 sec hold - Supine Bridge  - 1 x daily - 7 x weekly - 2 sets - 10 reps - 3 sec hold - Bird Dog  - 1 x daily - 7 x weekly - 1 sets - 10 reps - 5 sec hold - Sit to Stand  - 1 x daily - 7 x weekly - 2 sets - 10 reps - Prone Hamstring Curl with Anchored Resistance  - 1 x daily - 7 x weekly - 2 sets - 10 reps - 3 sec hold - Prone Hip Extension  - 1 x daily - 7 x weekly - 1 sets - 10 reps - Wall Slide with Posterior Pelvic Tilt  - 1 x daily - 7 x weekly - 2 sets - 10 reps - 3 sec hold - Standing Hip Abduction with Bent Knee  - 1 x daily - 7 x weekly - 3 sets - 10 reps - Prone W Scapular Retraction  - 1 x daily - 7 x  weekly - 2 sets - 10 reps - Seated Shoulder Horizontal Abduction with Resistance  - 1 x daily - 7 x weekly - 2 sets - 10 reps - Isometric Standing Shoulder Internal Rotation - 90 Degrees Abduction  - 1 x  daily - 7 x weekly - 2 sets - 10 reps  ASSESSMENT:  CLINICAL IMPRESSION: Focused on midback and shoulder strengthening this session. Reviewed HEP and discussed modifications. Pt tends to over utilize upper traps. Continued work on improving glute and core activation in standing and anti gravity positions.    GOALS: Goals reviewed with patient? Yes  SHORT TERM GOALS: Target date: 08/10/2022   Pt will be ind with initial HEP Baseline: Goal status: INITIAL  2.  Pt will have L = R SLR to demo improved hamstring extensibility for bending Baseline:  Goal status: INITIAL    LONG TERM GOALS: Target date: 09/07/2022   Pt will demo pain free lumbar ROM Baseline:  Goal status: INITIAL  2.  Pt will be able to stand and sit for >2 hours with pain </=2/10 Baseline:  Goal status: INITIAL  3.  Pt will be ind with progressing to community wellness activity/exercise  Baseline:  Goal status: INITIAL  4.  Pt will demo safe biomechanics for lifting at least 20# for cleaning tasks Baseline:  Goal status: INITIAL  5.  Pt will have increased FOTO score 62 Baseline:  Goal status: INITIAL  6.  Pt will be able to maintain a plank to at least 1 min to be within average norms for trunk stability Baseline:  Goal status: INITIAL  7.  Pt will demo pain free L = R shoulder AROM for all overhead tasks Baseline:  Goal status: INITIAL   PLAN:  PT FREQUENCY: every other week  PT DURATION: 8 weeks  PLANNED INTERVENTIONS: Therapeutic exercises, Therapeutic activity, Neuromuscular re-education, Balance training, Gait training, Patient/Family education, Self Care, Joint mobilization, Stair training, Aquatic Therapy, Dry Needling, Electrical stimulation, Spinal mobilization, Cryotherapy, Moist  heat, Taping, Traction, Ionotophoresis '4mg'$ /ml Dexamethasone, and Manual therapy.  PLAN FOR NEXT SESSION: Assess response to HEP/extension program. Manual work as indicated for lumbar paraspinals. Strengthen core and hips. Work on [place] and core activation in standing. Continue to strengthen periscapular muscles/postural stabilizers.    Skip Litke April Ma L Chaye Misch, PT 08/12/2022, 1:13 PM

## 2022-08-24 ENCOUNTER — Ambulatory Visit: Payer: No Typology Code available for payment source | Admitting: Physical Therapy

## 2022-08-24 ENCOUNTER — Encounter: Payer: Self-pay | Admitting: Physical Therapy

## 2022-08-24 DIAGNOSIS — M6281 Muscle weakness (generalized): Secondary | ICD-10-CM

## 2022-08-24 DIAGNOSIS — M25512 Pain in left shoulder: Secondary | ICD-10-CM

## 2022-08-24 DIAGNOSIS — R293 Abnormal posture: Secondary | ICD-10-CM

## 2022-08-24 DIAGNOSIS — M25612 Stiffness of left shoulder, not elsewhere classified: Secondary | ICD-10-CM

## 2022-08-24 DIAGNOSIS — M5459 Other low back pain: Secondary | ICD-10-CM | POA: Diagnosis not present

## 2022-08-24 NOTE — Therapy (Signed)
OUTPATIENT PHYSICAL THERAPY TREATMENT   Patient Name: Sheena Yang MRN: [id number] DOB:[birth year], 52 y.o., female Today's Date: 08/24/2022  END OF SESSION:  PT End of Session - 08/24/22 1318     Visit Number 7    Number of Visits 16    Date for PT Re-Evaluation 09/07/22    Authorization Type [employer]et[employer]    PT Start Time 1318    PT Stop Time 1400    PT Time Calculation (min) 42 min    Activity Tolerance Patient tolerated treatment well    Behavior During Therapy [facility] for tasks assessed/performed                Past Medical History:  Diagnosis Date   Asthma    Migraines    Past Surgical History:  Procedure Laterality Date   BREAST SURGERY     reduction 2009   CERVICAL FUSION     Patient Active Problem List   Diagnosis Date Noted   Carpal tunnel syndrome, bilateral 07/27/2022   S/P cervical spinal fusion 07/27/2022   Lumbar degenerative disc disease 06/16/2022   BRONCHITIS, ACUTE WITH BRONCHOSPASM 03/29/2008   ASTHMA, ACUTE 03/29/2008    PCP: Jani Gravel MD  REFERRING PROVIDER: Silverio Decamp, MD  REFERRING DIAG: M51.36 (ICD-10-CM) - Lumbar degenerative disc disease  Rationale for Evaluation and Treatment: Rehabilitation  THERAPY DIAG:  Other low back pain  Muscle weakness (generalized)  Abnormal posture  Left shoulder pain, unspecified chronicity  Stiffness of left shoulder, not elsewhere classified  ONSET DATE: ~1 month ago flare up but likely multiple months  SUBJECTIVE:                                                                                                                                                                                           SUBJECTIVE STATEMENT: "I have nothing new to report. It's been slow progress. I am scheduled for an injection but I haven't gotten a call yet." Does note that the midback pain has decreased in frequency.   PERTINENT HISTORY:  Reports history of cervical fusion From eval: Pt states  she saw Dr. Darene Lamer a month ago for her back. Pt reports she was having trouble walking. Unable to reach her feet. Pt states she was told she has a herniated disc. Pt has been doing exercises given to her by Dr T. Pt does a hot yoga class -- every time after she notes she gets a lot of pain. Was starting to feel better with some rest (has tried to sit more throughout the day) but after going back to regular activities she hurts again.  PAIN:  Are you having pain? Yes: NPRS scale: Currently 5, at worst 9/10 Pain location: low mid back Pain description: Just a few times going down R leg, dull/ache Aggravating factors: 1.5 hours of standing, sitting long periods (worst) Relieving factors: Rest  PRECAUTIONS: None  WEIGHT BEARING RESTRICTIONS: No  FALLS:  Has patient fallen in last 6 months? No  LIVING ENVIRONMENT: Lives with: lives with their family 2 kids Lives in: House/apartment Stairs: n/a Has following equipment at home: None  OCCUPATION: Stands all day with standing desk  PLOF: Independent  PATIENT GOALS: Improve pain with all activities  NEXT MD VISIT: n/a  OBJECTIVE: (Measures in this section from initial evaluation unless otherwise noted)  DIAGNOSTIC FINDINGS:  Lumbar x-ray 06/17/22: IMPRESSION: Negative.  PATIENT SURVEYS:  FOTO 47; predicted 62  MUSCLE LENGTH: Hamstrings: Right 80 deg; Left 70 deg Thomas test: Right -10 deg; Left 0 deg  PALPATION: Mostly TTP in lower lumbar paraspinals. Tender with Sacral Pas.   LUMBAR ROM:   AROM eval  Flexion 30% *  Extension 80%  Right lateral flexion 2" above knee *  Left lateral flexion 2" above knee *  Right rotation 90%  Left rotation 90%   (Blank rows = not tested)  LOWER EXTREMITY MMT:    MMT Right eval Left eval  Hip flexion 4+ 4+  Hip extension 4+ 4  Hip abduction 4 4-  Hip adduction    Hip internal rotation    Hip external rotation    Knee flexion 3+ 3+  Knee extension 5 5  Ankle dorsiflexion    Ankle  plantarflexion    Ankle inversion    Ankle eversion     (Blank rows = not tested)  UPPER EXTREMITY MMT:  MMT Right eval Left eval  Shoulder flexion 5 4+  Shoulder extension 5 5  Shoulder abduction 5 4+  Shoulder adduction    Shoulder extension 5 5  Shoulder internal rotation 5 5  Shoulder external rotation 5 4+  Middle trapezius 5 3+  Lower trapezius    Elbow flexion    Elbow extension    Wrist flexion    Wrist extension    Wrist ulnar deviation    Wrist radial deviation    Wrist pronation    Wrist supination    Grip strength     (Blank rows = not tested)   UPPER EXTREMITY ROM:  MMT Right eval Left eval  Shoulder flexion 160 145  Shoulder extension 50 48  Shoulder abduction 165 140*  Shoulder internal rotation T3 T5  Shoulder external rotation T3 T3   (Blank rows = not tested) * = pain  CERVICAL ROM:   Active ROM A/PROM (deg) eval  Flexion 30  Extension 45  Right lateral flexion 30  Left lateral flexion 30  Right rotation 40  Left rotation 40   (Blank rows = not tested)   LUMBAR SPECIAL TESTS:  Straight leg raise test: Positive, FABER test: Negative, and Thomas test: Negative  FUNCTIONAL TESTS:  Plank: on hands and toes x 1 min 15 sec(07/15/22)  Squat: to be assessed  TODAY'S TREATMENT:       [facility] Adult PT Treatment:                                                DATE: 08/24/22 Therapeutic Exercise: Sidelying open/close book x10  Piriformis  stretch 2x30 sec Sup ITB stretch x30 sec Bird dog yellow TB 2x10 Quadruped fire hydrant yellow TB 2x10 [place] 10x 5 sec   [facility] Adult PT Treatment:                                                DATE: 08/12/22 Therapeutic Exercise: Sidelying open/close book x10 R&L Prone "W" 2x10 Prone "T" 2x10 Shoulder ER red TB reactive iso at 90 deg abd 2x10 Shoulder IR red TB reactive iso at 90 deg abd 2x10 Standing PPT x10 Table plank with PPT x10 Table plank + hip ext x10   [facility] Adult PT Treatment:                                                 DATE: 07/29/22 Therapeutic Exercise: Supine hamstring stretch 2x30 sec Prone "W" x10  Prone hamstring curl red TB 2x10 Prone glute set with hip extended 10 deg 2x10x3 sec Standing horse stance x30 sec Standing fire hydrant red TB 2x10  Attempted Carma Leaven but pt unable to obtain good glute activity Sitting horizontal abd red TB 2x10 Standing shoulder IR reactive iso red TB x10 Manual Therapy: STM & TPR L low trap/mid trap Therapeutic Activity: See cervical/UE assessment above   PATIENT EDUCATION:  Education details: Discussed SI belt if pain is coming from her PSIS Person educated: Patient Education method: Explanation, Demonstration, and Handouts Education comprehension: verbalized understanding, returned demonstration, and needs further education  HOME EXERCISE PROGRAM: Access Code: [email or web address] URL: [email or web address] Date: 07/29/2022 Prepared by: Estill Bamberg April Thurnell Garbe  Exercises - Prone Press Up On Elbows  - 1 x daily - 7 x weekly - 1 sets - 10 reps - 3 sec hold - Supine Bridge  - 1 x daily - 7 x weekly - 2 sets - 10 reps - 3 sec hold - Bird Dog  - 1 x daily - 7 x weekly - 1 sets - 10 reps - 5 sec hold - Sit to Stand  - 1 x daily - 7 x weekly - 2 sets - 10 reps - Prone Hamstring Curl with Anchored Resistance  - 1 x daily - 7 x weekly - 2 sets - 10 reps - 3 sec hold - Prone Hip Extension  - 1 x daily - 7 x weekly - 1 sets - 10 reps - Wall Slide with Posterior Pelvic Tilt  - 1 x daily - 7 x weekly - 2 sets - 10 reps - 3 sec hold - Standing Hip Abduction with Bent Knee  - 1 x daily - 7 x weekly - 3 sets - 10 reps - Prone W Scapular Retraction  - 1 x daily - 7 x weekly - 2 sets - 10 reps - Seated Shoulder Horizontal Abduction with Resistance  - 1 x daily - 7 x weekly - 2 sets - 10 reps - Isometric Standing Shoulder Internal Rotation - 90 Degrees Abduction  - 1 x daily - 7 x weekly - 2 sets - 10  reps  ASSESSMENT:  CLINICAL IMPRESSION: Treatment primarily focused on updating pt's back HEP. Able to improve glute activation this session. Able to feel it during [facility] today which she has not during  previous sessions. Has not been able to try leaning against wall while working. Discussed SI belt an added option to give further support along her sacrum (pt reports this is the primary point of pressure with prolonged standing). Upper/midback has been improving -- will update this on next session.    GOALS: Goals reviewed with patient? Yes  SHORT TERM GOALS: Target date: 08/10/2022   Pt will be ind with initial HEP Baseline: Goal status: MET  2.  Pt will have L = R SLR to demo improved hamstring extensibility for bending Baseline:  Goal status: IN PROGRESS    LONG TERM GOALS: Target date: 09/07/2022   Pt will demo pain free lumbar ROM Baseline:  Goal status: INITIAL  2.  Pt will be able to stand and sit for >2 hours with pain </=2/10 Baseline:  Goal status: INITIAL  3.  Pt will be ind with progressing to community wellness activity/exercise  Baseline:  Goal status: INITIAL  4.  Pt will demo safe biomechanics for lifting at least 20# for cleaning tasks Baseline:  Goal status: INITIAL  5.  Pt will have increased FOTO score 62 Baseline:  Goal status: INITIAL  6.  Pt will be able to maintain a plank to at least 1 min to be within average norms for trunk stability Baseline:  Goal status: INITIAL  7.  Pt will demo pain free L = R shoulder AROM for all overhead tasks Baseline:  Goal status: INITIAL   PLAN:  PT FREQUENCY: every other week  PT DURATION: 8 weeks  PLANNED INTERVENTIONS: Therapeutic exercises, Therapeutic activity, Neuromuscular re-education, Balance training, Gait training, Patient/Family education, Self Care, Joint mobilization, Stair training, Aquatic Therapy, Dry Needling, Electrical stimulation, Spinal mobilization, Cryotherapy, Moist  heat, Taping, Traction, Ionotophoresis 4mg /ml Dexamethasone, and Manual therapy.  PLAN FOR NEXT SESSION: Assess response to HEP/extension program. Manual work as indicated for lumbar paraspinals. Strengthen core and hips. Work on [place] and core activation in standing. Continue to strengthen periscapular muscles/postural stabilizers.    Lester Platas April Ma L Lorn Butcher, PT 08/24/2022, 1:18 PM

## 2022-08-25 NOTE — Telephone Encounter (Signed)
Called and spoke with patient. Given the number to [facility] radiology . She will contact them to schedule.

## 2022-08-31 ENCOUNTER — Ambulatory Visit: Payer: No Typology Code available for payment source | Admitting: Physical Therapy

## 2022-08-31 ENCOUNTER — Encounter: Payer: Self-pay | Admitting: Physical Therapy

## 2022-08-31 DIAGNOSIS — M5459 Other low back pain: Secondary | ICD-10-CM

## 2022-08-31 DIAGNOSIS — M6281 Muscle weakness (generalized): Secondary | ICD-10-CM

## 2022-08-31 DIAGNOSIS — M25512 Pain in left shoulder: Secondary | ICD-10-CM

## 2022-08-31 DIAGNOSIS — R293 Abnormal posture: Secondary | ICD-10-CM

## 2022-08-31 DIAGNOSIS — M25612 Stiffness of left shoulder, not elsewhere classified: Secondary | ICD-10-CM

## 2022-08-31 NOTE — Therapy (Unsigned)
OUTPATIENT PHYSICAL THERAPY TREATMENT   Patient Name: Sheena Yang MRN: [id number] DOB:02-23-1971, 52 y.o., female Today's Date: 08/31/2022  END OF SESSION:  PT End of Session - 08/31/22 1315     Visit Number 8    Number of Visits 16    Date for PT Re-Evaluation 09/07/22    Authorization Type [employer]    PT Start Time 1316    PT Stop Time 1400    PT Time Calculation (min) 44 min    Activity Tolerance Patient tolerated treatment well    Behavior During Therapy WFL for tasks assessed/performed                Past Medical History:  Diagnosis Date   Asthma    Migraines    Past Surgical History:  Procedure Laterality Date   BREAST SURGERY     reduction 2009   CERVICAL FUSION     Patient Active Problem List   Diagnosis Date Noted   Carpal tunnel syndrome, bilateral 07/27/2022   S/P cervical spinal fusion 07/27/2022   Lumbar degenerative disc disease 06/16/2022   BRONCHITIS, ACUTE WITH BRONCHOSPASM 03/29/2008   ASTHMA, ACUTE 03/29/2008    PCP: Jani Gravel MD  REFERRING PROVIDER: Silverio Decamp, MD  REFERRING DIAG: M51.36 (ICD-10-CM) - Lumbar degenerative disc disease  Rationale for Evaluation and Treatment: Rehabilitation  THERAPY DIAG:  Other low back pain  Muscle weakness (generalized)  Abnormal posture  Left shoulder pain, unspecified chronicity  Stiffness of left shoulder, not elsewhere classified  ONSET DATE: ~1 month ago flare up but likely multiple months  SUBJECTIVE:                                                                                                                                                                                           SUBJECTIVE STATEMENT: Pt states that the low back is improving but can pay attention that her pain is lower closer to her SIJ. Midback has been getting better as well. Low back is currently not exacerbated. Has not tried SI belt. Pt states she is scheduled for epidural.   PERTINENT  HISTORY:  Reports history of cervical fusion From eval: Pt states she saw Dr. Darene Lamer a month ago for her back. Pt reports she was having trouble walking. Unable to reach her feet. Pt states she was told she has a herniated disc. Pt has been doing exercises given to her by Dr T. Pt does a hot yoga class -- every time after she notes she gets a lot of pain. Was starting to feel better with some rest (has tried to sit more throughout the day)  but after going back to regular activities she hurts again.  PAIN:  Are you having pain? Yes: NPRS scale: Currently 0, at worst 9/10 Pain location: low mid back Pain description: Just a few times going down R leg, dull/ache Aggravating factors: 1.5 hours of standing, sitting long periods (worst) Relieving factors: Rest  PRECAUTIONS: None  WEIGHT BEARING RESTRICTIONS: No  FALLS:  Has patient fallen in last 6 months? No  LIVING ENVIRONMENT: Lives with: lives with their family 2 kids Lives in: House/apartment Stairs: n/a Has following equipment at home: None  OCCUPATION: Stands all day with standing desk  PLOF: Independent  PATIENT GOALS: Improve pain with all activities  NEXT MD VISIT: n/a  OBJECTIVE: (Measures in this section from initial evaluation unless otherwise noted)  DIAGNOSTIC FINDINGS:  Lumbar x-ray 06/17/22: IMPRESSION: Negative.  PATIENT SURVEYS:  FOTO 47; predicted 62  MUSCLE LENGTH: Hamstrings: Right 80 deg; Left 70 deg Thomas test: Right -10 deg; Left 0 deg  PALPATION: Mostly TTP in lower lumbar paraspinals. Tender with Sacral Pas.   LUMBAR ROM:   AROM eval  Flexion 30% *  Extension 80%  Right lateral flexion 2" above knee *  Left lateral flexion 2" above knee *  Right rotation 90%  Left rotation 90%   (Blank rows = not tested)  LOWER EXTREMITY MMT:    MMT Right eval Left eval  Hip flexion 4+ 4+  Hip extension 4+ 4  Hip abduction 4 4-  Hip adduction    Hip internal rotation    Hip external rotation     Knee flexion 3+ 3+  Knee extension 5 5  Ankle dorsiflexion    Ankle plantarflexion    Ankle inversion    Ankle eversion     (Blank rows = not tested)  UPPER EXTREMITY MMT:  MMT Right eval Left eval  Shoulder flexion 5 4+  Shoulder extension 5 5  Shoulder abduction 5 4+  Shoulder adduction    Shoulder extension 5 5  Shoulder internal rotation 5 5  Shoulder external rotation 5 4+  Middle trapezius 5 3+  Lower trapezius    Elbow flexion    Elbow extension    Wrist flexion    Wrist extension    Wrist ulnar deviation    Wrist radial deviation    Wrist pronation    Wrist supination    Grip strength     (Blank rows = not tested)   UPPER EXTREMITY ROM:  MMT Right eval Left eval  Shoulder flexion 160 145  Shoulder extension 50 48  Shoulder abduction 165 140*  Shoulder internal rotation T3 T5  Shoulder external rotation T3 T3   (Blank rows = not tested) * = pain  CERVICAL ROM:   Active ROM A/PROM (deg) eval  Flexion 30  Extension 45  Right lateral flexion 30  Left lateral flexion 30  Right rotation 40  Left rotation 40   (Blank rows = not tested)   LUMBAR SPECIAL TESTS:  Straight leg raise test: Positive, FABER test: Negative, and Thomas test: Negative  08/31/22: Supine to long sit: L LE short to neutral Pt notes some discomfort with PA mobs L iliac crest in supine pushing into posterior rotation Forward flexion with increased R PSIS posterior rotation vs L PSIS rotation  FUNCTIONAL TESTS:  Plank: on hands and toes x 1 min 15 sec(07/15/22)  Squat: to be assessed  TODAY'S TREATMENT:      [facility] Adult PT Treatment:  DATE: 08/31/22 Therapeutic Exercise: Supine Hamstring stretch x30 sec Sciatic nerve glide x10 SIJ correction 5x5 sec Ball squeeze with ab set + PFM 10x3 sec Ball squeeze + ab set + PFM + bridge x10 Quadruped Bird dog with diaphragmatic breaths + PFM contraction x10    [facility] Adult PT Treatment:                                                 DATE: 08/24/22 Therapeutic Exercise: Sidelying open/close book x10  Piriformis stretch 2x30 sec Sup ITB stretch x30 sec Bird dog yellow TB 2x10 Quadruped fire hydrant yellow TB 2x10 [place] 10x 5 sec   [facility] Adult PT Treatment:                                                DATE: 08/12/22 Therapeutic Exercise: Sidelying open/close book x10 R&L Prone "W" 2x10 Prone "T" 2x10 Shoulder ER red TB reactive iso at 90 deg abd 2x10 Shoulder IR red TB reactive iso at 90 deg abd 2x10 Standing PPT x10 Table plank with PPT x10 Table plank + hip ext x10   [facility] Adult PT Treatment:                                                DATE: 07/29/22 Therapeutic Exercise: Supine hamstring stretch 2x30 sec Prone "W" x10  Prone hamstring curl red TB 2x10 Prone glute set with hip extended 10 deg 2x10x3 sec Standing horse stance x30 sec Standing fire hydrant red TB 2x10  Attempted Carma Leaven but pt unable to obtain good glute activity Sitting horizontal abd red TB 2x10 Standing shoulder IR reactive iso red TB x10 Manual Therapy: STM & TPR L low trap/mid trap Therapeutic Activity: See cervical/UE assessment above   PATIENT EDUCATION:  Education details: Discussed SI belt if pain is coming from her PSIS Person educated: Patient Education method: Explanation, Demonstration, and Handouts Education comprehension: verbalized understanding, returned demonstration, and needs further education  HOME EXERCISE PROGRAM: Access Code: [email or web address] URL: [email or web address] Date: 07/29/2022 Prepared by: Estill Bamberg April Thurnell Garbe  Exercises - Prone Press Up On Elbows  - 1 x daily - 7 x weekly - 1 sets - 10 reps - 3 sec hold - Supine Bridge  - 1 x daily - 7 x weekly - 2 sets - 10 reps - 3 sec hold - Bird Dog  - 1 x daily - 7 x weekly - 1 sets - 10 reps - 5 sec hold - Sit to Stand  - 1 x daily - 7 x weekly - 2 sets - 10 reps - Prone Hamstring  Curl with Anchored Resistance  - 1 x daily - 7 x weekly - 2 sets - 10 reps - 3 sec hold - Prone Hip Extension  - 1 x daily - 7 x weekly - 1 sets - 10 reps - Wall Slide with Posterior Pelvic Tilt  - 1 x daily - 7 x weekly - 2 sets - 10 reps - 3 sec hold - Standing Hip Abduction with Bent Knee  - 1 x daily -  7 x weekly - 3 sets - 10 reps - Prone W Scapular Retraction  - 1 x daily - 7 x weekly - 2 sets - 10 reps - Seated Shoulder Horizontal Abduction with Resistance  - 1 x daily - 7 x weekly - 2 sets - 10 reps - Isometric Standing Shoulder Internal Rotation - 90 Degrees Abduction  - 1 x daily - 7 x weekly - 2 sets - 10 reps  ASSESSMENT:  CLINICAL IMPRESSION: Pt notes upper midback pain remains well maintained - did not feel we needed to progress these exercises. Pt states she is feeling it less in her lumbar spine and reports she feels it more along her SIJ with prolonged standing. Pt appears to have s/s consistent with L posteriorly rotated inominate compared to R. Pain is not currently flared; however, discussed with pt trial of SIJ corrections, SI belt, and adding more pelvic strengthening exercises to see if this further improves her standing pain.    GOALS: Goals reviewed with patient? Yes  SHORT TERM GOALS: Target date: 08/10/2022   Pt will be ind with initial HEP Baseline: Goal status: MET  2.  Pt will have L = R SLR to demo improved hamstring extensibility for bending Baseline:  Goal status: IN PROGRESS    LONG TERM GOALS: Target date: 09/07/2022   Pt will demo pain free lumbar ROM Baseline:  Goal status: INITIAL  2.  Pt will be able to stand and sit for >2 hours with pain </=2/10 Baseline:  Goal status: INITIAL  3.  Pt will be ind with progressing to community wellness activity/exercise  Baseline:  Goal status: INITIAL  4.  Pt will demo safe biomechanics for lifting at least 20# for cleaning tasks Baseline:  Goal status: INITIAL  5.  Pt will have increased FOTO  score 62 Baseline:  Goal status: INITIAL  6.  Pt will be able to maintain a plank to at least 1 min to be within average norms for trunk stability Baseline:  Goal status: INITIAL  7.  Pt will demo pain free L = R shoulder AROM for all overhead tasks Baseline:  Goal status: INITIAL   PLAN:  PT FREQUENCY: every other week  PT DURATION: 8 weeks  PLANNED INTERVENTIONS: Therapeutic exercises, Therapeutic activity, Neuromuscular re-education, Balance training, Gait training, Patient/Family education, Self Care, Joint mobilization, Stair training, Aquatic Therapy, Dry Needling, Electrical stimulation, Spinal mobilization, Cryotherapy, Moist heat, Taping, Traction, Ionotophoresis 4mg /ml Dexamethasone, and Manual therapy.  PLAN FOR NEXT SESSION: Assess response to HEP/extension program. Check SIJ alignment. Manual work as indicated for lumbar paraspinals. Strengthen core and hips. Work on [place] and core activation in standing. Continue to strengthen periscapular muscles/postural stabilizers.    Shanikia Kernodle April Ma L Liannah Yarbough, PT 08/31/2022, 1:16 PM

## 2022-09-02 ENCOUNTER — Ambulatory Visit
Admission: RE | Admit: 2022-09-02 | Discharge: 2022-09-02 | Disposition: A | Discharge status: Home / Self Care | Payer: No Typology Code available for payment source | Source: Ambulatory Visit | Attending: Sports Medicine | Admitting: Sports Medicine

## 2022-09-02 DIAGNOSIS — M5136 Other intervertebral disc degeneration, lumbar region: Secondary | ICD-10-CM

## 2022-09-02 MED ORDER — METHYLPREDNISOLONE ACETATE 40 MG/ML INJ SUSP (RADIOLOG
80.0000 mg | Freq: Once | INTRAMUSCULAR | Status: AC
  Administered 2022-09-02: 80 mg via EPIDURAL

## 2022-09-02 MED ORDER — IOPAMIDOL (ISOVUE-M 200) INJECTION 41%
1.0000 mL | Freq: Once | INTRAMUSCULAR | Status: AC
  Administered 2022-09-02: 1 mL via EPIDURAL

## 2022-09-02 NOTE — Discharge Instructions (Signed)

## 2022-09-07 ENCOUNTER — Encounter: Payer: Self-pay | Admitting: Physical Therapy

## 2022-09-07 ENCOUNTER — Ambulatory Visit: Payer: No Typology Code available for payment source | Attending: Sports Medicine | Admitting: Physical Therapy

## 2022-09-07 DIAGNOSIS — M25612 Stiffness of left shoulder, not elsewhere classified: Secondary | ICD-10-CM | POA: Diagnosis present

## 2022-09-07 DIAGNOSIS — M5459 Other low back pain: Secondary | ICD-10-CM | POA: Diagnosis present

## 2022-09-07 DIAGNOSIS — M6281 Muscle weakness (generalized): Secondary | ICD-10-CM | POA: Diagnosis present

## 2022-09-07 DIAGNOSIS — M25512 Pain in left shoulder: Secondary | ICD-10-CM | POA: Insufficient documentation

## 2022-09-07 DIAGNOSIS — R293 Abnormal posture: Secondary | ICD-10-CM | POA: Diagnosis present

## 2022-09-07 NOTE — Therapy (Signed)
OUTPATIENT PHYSICAL THERAPY TREATMENT   Patient Name: Sheena Yang MRN: [id number] DOB:[birth year], 52 y.o., female Today's Date: 09/07/2022  END OF SESSION:  PT End of Session - 09/07/22 1318     Visit Number 9    Number of Visits 16    Date for PT Re-Evaluation 09/07/22    Authorization Type [employer]et[employer]    PT Start Time 1318    PT Stop Time 1400    PT Time Calculation (min) 42 min    Activity Tolerance Patient tolerated treatment well    Behavior During Therapy [facility] for tasks assessed/performed             Past Medical History:  Diagnosis Date   Asthma    Migraines    Past Surgical History:  Procedure Laterality Date   BREAST SURGERY     reduction 2009   CERVICAL FUSION     Patient Active Problem List   Diagnosis Date Noted   Carpal tunnel syndrome, bilateral 07/27/2022   S/P cervical spinal fusion 07/27/2022   Lumbar degenerative disc disease 06/16/2022   BRONCHITIS, ACUTE WITH BRONCHOSPASM 03/29/2008   ASTHMA, ACUTE 03/29/2008    PCP: Jani Gravel MD  REFERRING PROVIDER: Silverio Decamp, MD  REFERRING DIAG: M51.36 (ICD-10-CM) - Lumbar degenerative disc disease  Rationale for Evaluation and Treatment: Rehabilitation  THERAPY DIAG:  Other low back pain  Muscle weakness (generalized)  Abnormal posture  ONSET DATE: ~1 month ago flare up but likely multiple months  SUBJECTIVE:                                                                                                                                                                                           SUBJECTIVE STATEMENT: Pt does "feel better" with the SI belt but could really feel the difference with the last SI stretches. Pt states she got the steroid lumbar injection and has been hurting. Pain has slowly been decreasing today.    PERTINENT HISTORY:  Reports history of cervical fusion From eval: Pt states she saw Dr. Darene Lamer a month ago for her back. Pt reports she was having trouble  walking. Unable to reach her feet. Pt states she was told she has a herniated disc. Pt has been doing exercises given to her by Dr T. Pt does a hot yoga class -- every time after she notes she gets a lot of pain. Was starting to feel better with some rest (has tried to sit more throughout the day) but after going back to regular activities she hurts again.  PAIN:  Are you having pain? Yes: NPRS scale: Currently 0, at worst 9/10  Pain location: low mid back Pain description: Just a few times going down R leg, dull/ache Aggravating factors: 1.5 hours of standing, sitting long periods (worst) Relieving factors: Rest  PRECAUTIONS: None  WEIGHT BEARING RESTRICTIONS: No  FALLS:  Has patient fallen in last 6 months? No  LIVING ENVIRONMENT: Lives with: lives with their family 2 kids Lives in: House/apartment Stairs: n/a Has following equipment at home: None  OCCUPATION: Stands all day with standing desk  PLOF: Independent  PATIENT GOALS: Improve pain with all activities  NEXT MD VISIT: n/a  OBJECTIVE: (Measures in this section from initial evaluation unless otherwise noted)  DIAGNOSTIC FINDINGS:  Lumbar x-ray 06/17/22: IMPRESSION: Negative.  PATIENT SURVEYS:  FOTO 47; predicted 62  MUSCLE LENGTH: Hamstrings: Right 80 deg; Left 70 deg Thomas test: Right -10 deg; Left 0 deg  PALPATION: Mostly TTP in lower lumbar paraspinals. Tender with Sacral Pas.   LUMBAR ROM:   AROM eval  Flexion 30% *  Extension 80%  Right lateral flexion 2" above knee *  Left lateral flexion 2" above knee *  Right rotation 90%  Left rotation 90%   (Blank rows = not tested)  LOWER EXTREMITY MMT:    MMT Right eval Left eval  Hip flexion 4+ 4+  Hip extension 4+ 4  Hip abduction 4 4-  Hip adduction    Hip internal rotation    Hip external rotation    Knee flexion 3+ 3+  Knee extension 5 5  Ankle dorsiflexion    Ankle plantarflexion    Ankle inversion    Ankle eversion     (Blank rows =  not tested)  UPPER EXTREMITY MMT:  MMT Right eval Left eval  Shoulder flexion 5 4+  Shoulder extension 5 5  Shoulder abduction 5 4+  Shoulder adduction    Shoulder extension 5 5  Shoulder internal rotation 5 5  Shoulder external rotation 5 4+  Middle trapezius 5 3+  Lower trapezius    Elbow flexion    Elbow extension    Wrist flexion    Wrist extension    Wrist ulnar deviation    Wrist radial deviation    Wrist pronation    Wrist supination    Grip strength     (Blank rows = not tested)   UPPER EXTREMITY ROM:  MMT Right eval Left eval  Shoulder flexion 160 145  Shoulder extension 50 48  Shoulder abduction 165 140*  Shoulder internal rotation T3 T5  Shoulder external rotation T3 T3   (Blank rows = not tested) * = pain  CERVICAL ROM:   Active ROM A/PROM (deg) eval  Flexion 30  Extension 45  Right lateral flexion 30  Left lateral flexion 30  Right rotation 40  Left rotation 40   (Blank rows = not tested)   LUMBAR SPECIAL TESTS:  Straight leg raise test: Positive, FABER test: Negative, and Thomas test: Negative  08/31/22: Supine to long sit: L LE short to neutral Pt notes some discomfort with PA mobs L iliac crest in supine pushing into posterior rotation Forward flexion with increased R PSIS posterior rotation vs L PSIS rotation  FUNCTIONAL TESTS:  Plank: on hands and toes x 1 min 15 sec(07/15/22)  Squat: to be assessed  TODAY'S TREATMENT:     [facility] Adult PT Treatment:  DATE: 09/07/22 Therapeutic Exercise: Supine Hamstring stretch 3x30 sec L LTR x10 Sciatic nerve glide x10 SIJ correction 5x5 sec Anterior tilt one leg on table x30 sec Ball squeeze + ab set + PFM + bridge x10 Ab set + DL lift 2x10 Sitting Lumbar extension x30 sec Prone Plank x1 min Modalities: Lumbar Traction x 10, 70# pull, 60 sec rest     [facility] Adult PT Treatment:                                                DATE:  08/31/22 Therapeutic Exercise: Supine Hamstring stretch x30 sec Sciatic nerve glide x10 SIJ correction 5x5 sec Ball squeeze with ab set + PFM 10x3 sec Ball squeeze + ab set + PFM + bridge x10 Quadruped Bird dog with diaphragmatic breaths + PFM contraction x10    [facility] Adult PT Treatment:                                                DATE: 08/24/22 Therapeutic Exercise: Sidelying open/close book x10  Piriformis stretch 2x30 sec Sup ITB stretch x30 sec Bird dog yellow TB 2x10 Quadruped fire hydrant yellow TB 2x10 [place] 10x 5 sec    PATIENT EDUCATION:  Education details: Discussed SI belt if pain is coming from her PSIS Person educated: Patient Education method: Explanation, Demonstration, and Handouts Education comprehension: verbalized understanding, returned demonstration, and needs further education  HOME EXERCISE PROGRAM: Access Code: [email or web address] URL: [email or web address] Date: 07/29/2022 Prepared by: Estill Bamberg April Thurnell Garbe  Exercises - Prone Press Up On Elbows  - 1 x daily - 7 x weekly - 1 sets - 10 reps - 3 sec hold - Supine Bridge  - 1 x daily - 7 x weekly - 2 sets - 10 reps - 3 sec hold - Bird Dog  - 1 x daily - 7 x weekly - 1 sets - 10 reps - 5 sec hold - Sit to Stand  - 1 x daily - 7 x weekly - 2 sets - 10 reps - Prone Hamstring Curl with Anchored Resistance  - 1 x daily - 7 x weekly - 2 sets - 10 reps - 3 sec hold - Prone Hip Extension  - 1 x daily - 7 x weekly - 1 sets - 10 reps - Wall Slide with Posterior Pelvic Tilt  - 1 x daily - 7 x weekly - 2 sets - 10 reps - 3 sec hold - Standing Hip Abduction with Bent Knee  - 1 x daily - 7 x weekly - 3 sets - 10 reps - Prone W Scapular Retraction  - 1 x daily - 7 x weekly - 2 sets - 10 reps - Seated Shoulder Horizontal Abduction with Resistance  - 1 x daily - 7 x weekly - 2 sets - 10 reps - Isometric Standing Shoulder Internal Rotation - 90 Degrees Abduction  - 1 x daily - 7 x weekly - 2 sets - 10  reps  ASSESSMENT:  CLINICAL IMPRESSION: Unable to fully check LTGs due to lumbar exacerbation since after receiving her epidural. Worked on gentle stretching, manual therapy and traction to address exacerbation. Continues to demo L SIJ posterior rotation. Difficulty addressing due to lumbar  pain this session but continued pelvic and core strengthening. She is demonstrating increase in core strength as demonstrated by her plank. Pt reports no issues with her midback/shoulder. Pt would benefit from continued PT to continue to address her issues. Reports good response to SIJ stretches.    GOALS: Goals reviewed with patient? Yes  SHORT TERM GOALS: Target date: 08/10/2022   Pt will be ind with initial HEP Baseline: Goal status: MET  2.  Pt will have L = R SLR to demo improved hamstring extensibility for bending Baseline:  Goal status: IN PROGRESS    LONG TERM GOALS: Target date: 09/07/2022   Pt will demo pain free lumbar ROM Baseline:  Goal status: IN PROGRESS 09/07/22 (exacerbated from last week)  2.  Pt will be able to stand and sit for >2 hours with pain </=2/10 Baseline:  Goal status: IN PROGRESS 09/07/22 (exacerbated from last week)  3.  Pt will be ind with progressing to community wellness activity/exercise  Baseline:  Goal status: IN PROGRESS 09/07/22 (exacerbated from last week)  4.  Pt will demo safe biomechanics for lifting at least 20# for cleaning tasks Baseline:  Goal status: IN PROGRESS 09/07/22 (exacerbated from last week)  5.  Pt will have increased FOTO score 62 Baseline:  Goal status: IN PROGRESS  6.  Pt will be able to maintain a plank to at least 1 min to be within average norms for trunk stability Baseline:  Goal status: MET  7.  Pt will demo pain free L = R shoulder AROM for all overhead tasks Baseline:  Goal status: MET   PLAN:  PT FREQUENCY: every other week  PT DURATION: 8 weeks  PLANNED INTERVENTIONS: Therapeutic exercises, Therapeutic activity,  Neuromuscular re-education, Balance training, Gait training, Patient/Family education, Self Care, Joint mobilization, Stair training, Aquatic Therapy, Dry Needling, Electrical stimulation, Spinal mobilization, Cryotherapy, Moist heat, Taping, Traction, Ionotophoresis 4mg /ml Dexamethasone, and Manual therapy.  PLAN FOR NEXT SESSION: Assess response to HEP. Check SIJ alignment. Manual work as indicated for lumbar paraspinals. Strengthen core and hips. Work on [place] and core activation in standing. Continue to strengthen periscapular muscles/postural stabilizers.    Delrico Minehart April Ma L Darrol Brandenburg, PT 09/07/2022, 1:19 PM

## 2022-09-15 ENCOUNTER — Other Ambulatory Visit (HOSPITAL_COMMUNITY): Payer: Self-pay

## 2022-09-16 ENCOUNTER — Encounter: Payer: Self-pay | Admitting: Physical Therapy

## 2022-09-16 ENCOUNTER — Ambulatory Visit: Payer: No Typology Code available for payment source | Admitting: Physical Therapy

## 2022-09-16 DIAGNOSIS — R293 Abnormal posture: Secondary | ICD-10-CM

## 2022-09-16 DIAGNOSIS — M5459 Other low back pain: Secondary | ICD-10-CM | POA: Diagnosis not present

## 2022-09-16 DIAGNOSIS — M6281 Muscle weakness (generalized): Secondary | ICD-10-CM

## 2022-09-16 DIAGNOSIS — M25512 Pain in left shoulder: Secondary | ICD-10-CM

## 2022-09-16 DIAGNOSIS — M25612 Stiffness of left shoulder, not elsewhere classified: Secondary | ICD-10-CM

## 2022-09-16 NOTE — Therapy (Signed)
OUTPATIENT PHYSICAL THERAPY TREATMENT   Patient Name: Sheena Yang MRN: [id number] DOB:12-21-1970, 52 y.o., female Today's Date: 09/16/2022  END OF SESSION:  PT End of Session - 09/16/22 1104     Visit Number 10    Number of Visits 16    Date for PT Re-Evaluation 09/07/22    Authorization Type [employer]    PT Start Time 1105    PT Stop Time 1145    PT Time Calculation (min) 40 min    Activity Tolerance Patient tolerated treatment well    [facility] tasks assessed/performed             Past Medical History:  Diagnosis Date   Asthma    Migraines    Past Surgical History:  Procedure Laterality Date   BREAST SURGERY     reduction 2009   CERVICAL FUSION     Patient Active Problem List   Diagnosis Date Noted   Carpal tunnel syndrome, bilateral 07/27/2022   S/P cervical spinal fusion 07/27/2022   Lumbar degenerative disc disease 06/16/2022   BRONCHITIS, ACUTE WITH BRONCHOSPASM 03/29/2008   ASTHMA, ACUTE 03/29/2008    PCP: Pearson GrippeKim, James MD  REFERRING PROVIDER: Monica Bectonhekkekandam, Thomas J, MD  REFERRING DIAG: M51.36 (ICD-10-CM) - Lumbar degenerative disc disease  Rationale for Evaluation and Treatment: Rehabilitation  THERAPY DIAG:  Other low back pain  Muscle weakness (generalized)  Abnormal posture  Left shoulder pain, unspecified chronicity  Stiffness of left shoulder, not elsewhere classified  ONSET DATE: ~1 month ago flare up but likely multiple months  SUBJECTIVE:                                                                                                                                                                                           SUBJECTIVE STATEMENT: Pt states she is feeling 100% better since the last visit. The pain exacerbation after injection decreased around Saturday. States her SIJ pain has also been decreasing.  PERTINENT HISTORY:  Reports history of cervical fusion From eval: Pt states she saw Dr. Karie Schwalbe a  month ago for her back. Pt reports she was having trouble walking. Unable to reach her feet. Pt states she was told she has a herniated disc. Pt has been doing exercises given to her by Dr T. Pt does a hot yoga class -- every time after she notes she gets a lot of pain. Was starting to feel better with some rest (has tried to sit more throughout the day) but after going back to regular activities she hurts again.  PAIN:  Are you having pain? Yes: NPRS scale: Currently 0, at  worst 9/10 Pain location: low mid back Pain description: Just a few times going down R leg, dull/ache Aggravating factors: 1.5 hours of standing, sitting long periods (worst) Relieving factors: Rest  PRECAUTIONS: None  WEIGHT BEARING RESTRICTIONS: No  FALLS:  Has patient fallen in last 6 months? No  LIVING ENVIRONMENT: Lives with: lives with their family 2 kids Lives in: House/apartment Stairs: n/a Has following equipment at home: None  OCCUPATION: Stands all day with standing desk  PLOF: Independent  PATIENT GOALS: Improve pain with all activities  NEXT MD VISIT: n/a  OBJECTIVE: (Measures in this section from initial evaluation unless otherwise noted)  DIAGNOSTIC FINDINGS:  Lumbar x-ray 06/17/22: IMPRESSION: Negative.  PATIENT SURVEYS:  FOTO 47; predicted 62  MUSCLE LENGTH: Hamstrings: Right 80 deg; Left 70 deg Thomas test: Right -10 deg; Left 0 deg  PALPATION: Mostly TTP in lower lumbar paraspinals. Tender with Sacral Pas.   LUMBAR ROM:   AROM eval 09/16/22  Flexion 30% * 50%  Extension 80% 80%  Right lateral flexion 2" above knee * 1" above knee  Left lateral flexion 2" above knee * 1" above knee  Right rotation 90%   Left rotation 90%    (Blank rows = not tested) * = concordant pain  LOWER EXTREMITY MMT:    MMT Right eval Left eval  Hip flexion 4+ 4+  Hip extension 4+ 4  Hip abduction 4 4-  Hip adduction    Hip internal rotation    Hip external rotation    Knee flexion 3+ 3+   Knee extension 5 5  Ankle dorsiflexion    Ankle plantarflexion    Ankle inversion    Ankle eversion     (Blank rows = not tested)  UPPER EXTREMITY MMT:  MMT Right eval Left eval  Shoulder flexion 5 4+  Shoulder extension 5 5  Shoulder abduction 5 4+  Shoulder adduction    Shoulder extension 5 5  Shoulder internal rotation 5 5  Shoulder external rotation 5 4+  Middle trapezius 5 3+  Lower trapezius    Elbow flexion    Elbow extension    Wrist flexion    Wrist extension    Wrist ulnar deviation    Wrist radial deviation    Wrist pronation    Wrist supination    Grip strength     (Blank rows = not tested)   UPPER EXTREMITY ROM:  MMT Right eval Left eval  Shoulder flexion 160 145  Shoulder extension 50 48  Shoulder abduction 165 140*  Shoulder internal rotation T3 T5  Shoulder external rotation T3 T3   (Blank rows = not tested) * = pain  CERVICAL ROM:   Active ROM A/PROM (deg) eval  Flexion 30  Extension 45  Right lateral flexion 30  Left lateral flexion 30  Right rotation 40  Left rotation 40   (Blank rows = not tested)   LUMBAR SPECIAL TESTS:  Straight leg raise test: Positive, FABER test: Negative, and Thomas test: Negative  08/31/22: Supine to long sit: L LE short to neutral Pt notes some discomfort with PA mobs L iliac crest in supine pushing into posterior rotation Forward flexion with increased R PSIS posterior rotation vs L PSIS rotation  FUNCTIONAL TESTS:  Plank: on hands and toes x 1 min 15 sec (07/15/22)  Squat: to be assessed  TODAY'S TREATMENT:     [facility] Adult PT Treatment:  DATE: 09/16/22 Therapeutic Exercise: Treadmill 1.6 mph x 5 min Sitting Hamstring stretch 3x30 sec Supine SIJ correction 5x5 sec SLR 3x10 Ab set + DL lift P54 Ab set + Leg ext + PFM contraction 2x10  Prone Anterior pelvic tilt one leg on table x30 sec Standing PPT against wall + marching + PFM 2x10 Fire hydrant  red TB + PFM 2x10   [facility] Adult PT Treatment:                                                DATE: 09/07/22 Therapeutic Exercise: Supine Hamstring stretch 3x30 sec L LTR x10 Sciatic nerve glide x10 SIJ correction 5x5 sec Anterior tilt one leg on table x30 sec Ball squeeze + ab set + PFM + bridge x10 Ab set + DL lift [id number] Sitting Lumbar extension x30 sec Prone Plank x1 min Modalities: Lumbar Traction x 10, 70# pull, 60 sec rest     [facility] Adult PT Treatment:                                                DATE: 08/31/22 Therapeutic Exercise: Supine Hamstring stretch x30 sec Sciatic nerve glide x10 SIJ correction 5x5 sec Ball squeeze with ab set + PFM 10x3 sec Ball squeeze + ab set + PFM + bridge x10 Quadruped Bird dog with diaphragmatic breaths + PFM contraction x10    [facility] Adult PT Treatment:                                                DATE: 08/24/22 Therapeutic Exercise: Sidelying open/close book x10  Piriformis stretch 2x30 sec Sup ITB stretch x30 sec Bird dog yellow TB 2x10 Quadruped fire hydrant yellow TB 2x10 [place] 10x 5 sec    PATIENT EDUCATION:  Education details: Discussed SI belt if pain is coming from her PSIS Person educated: Patient Education method: Explanation, Demonstration, and Handouts Education comprehension: verbalized understanding, returned demonstration, and needs further education  HOME EXERCISE PROGRAM: Access Code: [email or web address] URL: [email or web address] Date: 07/29/2022 Prepared by: Vernon Prey April Kirstie Peri  Exercises - Prone Press Up On Elbows  - 1 x daily - 7 x weekly - 1 sets - 10 reps - 3 sec hold - Supine Bridge  - 1 x daily - 7 x weekly - 2 sets - 10 reps - 3 sec hold - Bird Dog  - 1 x daily - 7 x weekly - 1 sets - 10 reps - 5 sec hold - Sit to Stand  - 1 x daily - 7 x weekly - 2 sets - 10 reps - Prone Hamstring Curl with Anchored Resistance  - 1 x daily - 7 x weekly - 2 sets - 10 reps - 3 sec hold - Prone  Hip Extension  - 1 x daily - 7 x weekly - 1 sets - 10 reps - Wall Slide with Posterior Pelvic Tilt  - 1 x daily - 7 x weekly - 2 sets - 10 reps - 3 sec hold - Standing Hip Abduction with Bent Knee  - 1 x daily - 7  x weekly - 3 sets - 10 reps - Prone W Scapular Retraction  - 1 x daily - 7 x weekly - 2 sets - 10 reps - Seated Shoulder Horizontal Abduction with Resistance  - 1 x daily - 7 x weekly - 2 sets - 10 reps - Isometric Standing Shoulder Internal Rotation - 90 Degrees Abduction  - 1 x daily - 7 x weekly - 2 sets - 10 reps  ASSESSMENT:  CLINICAL IMPRESSION: Improving pain. Continued pelvic stabilization and core strengthening. Concentrated on single leg weight bearing/strengthening without increased pelvic rotation or pain. Pt is slowly meeting her LTGs. Pt would benefit from continued PT to fully meet her goals and maximize her level of function.    GOALS: Goals reviewed with patient? Yes  SHORT TERM GOALS: Target date: 08/10/2022   Pt will be ind with initial HEP Baseline: Goal status: MET  2.  Pt will have L = R SLR to demo improved hamstring extensibility for bending Baseline:  Goal status: IN PROGRESS    LONG TERM GOALS: Target date: 10/28/2022   Pt will demo pain free lumbar ROM Baseline:  09/16/22: WFL Goal status: IN PROGRESS   2.  Pt will be able to stand and sit for >2 hours with pain </=2/10 Baseline:  09/16/22 Reports improving standing and sitting with SIJ Goal status: IN PROGRESS   3.  Pt will be ind with progressing to community wellness activity/exercise  Baseline:  09/16/22 mild symptoms while using treadmill Goal status: IN PROGRESS   4.  Pt will demo safe biomechanics for lifting at least 20# for cleaning tasks Baseline:  09/16/22 did not assess Goal status: IN PROGRESS   5.  Pt will have increased FOTO score 62 Baseline:  Goal status: IN PROGRESS  6.  Pt will be able to maintain a plank to at least 1 min to be within average norms for trunk  stability Baseline:  Goal status: MET  7.  Pt will demo pain free L = R shoulder AROM for all overhead tasks Baseline:  Goal status: MET   PLAN:  PT FREQUENCY: every other week  PT DURATION: 6 weeks  PLANNED INTERVENTIONS: Therapeutic exercises, Therapeutic activity, Neuromuscular re-education, Balance training, Gait training, Patient/Family education, Self Care, Joint mobilization, Stair training, Aquatic Therapy, Dry Needling, Electrical stimulation, Spinal mobilization, Cryotherapy, Moist heat, Taping, Traction, Ionotophoresis 4mg /ml Dexamethasone, and Manual therapy.  PLAN FOR NEXT SESSION: FOTO! Assess response to HEP. Check SIJ alignment. Manual work as indicated for lumbar paraspinals. Strengthen core and hips. Work on glute and core activation in standing and with single leg weight bearing/weight shift.    Prentiss Hammett April Ma L Edna Rede, PT 09/16/2022, 11:05 AM

## 2022-09-23 ENCOUNTER — Encounter: Payer: Self-pay | Admitting: Physical Therapy

## 2022-09-23 ENCOUNTER — Ambulatory Visit: Payer: No Typology Code available for payment source | Admitting: Physical Therapy

## 2022-09-23 DIAGNOSIS — R293 Abnormal posture: Secondary | ICD-10-CM

## 2022-09-23 DIAGNOSIS — M5459 Other low back pain: Secondary | ICD-10-CM | POA: Diagnosis not present

## 2022-09-23 DIAGNOSIS — M6281 Muscle weakness (generalized): Secondary | ICD-10-CM

## 2022-09-23 NOTE — Therapy (Signed)
OUTPATIENT PHYSICAL THERAPY TREATMENT   Patient Name: Sheena Yang MRN: [id number] DOB:Nov 02, 1970, 51 y.o., female Today's Date: 09/23/2022  END OF SESSION:  PT End of Session - 09/23/22 1405     Visit Number 11    Number of Visits 16    Date for PT Re-Evaluation 10/30/22    Authorization Type [employer]et[employer]    PT Start Time 1405    PT Stop Time 1445    PT Time Calculation (min) 40 min    Activity Tolerance Patient tolerated treatment well    Behavior During Therapy [facility] for tasks assessed/performed             Past Medical History:  Diagnosis Date   Asthma    Migraines    Past Surgical History:  Procedure Laterality Date   BREAST SURGERY     reduction 2009   CERVICAL FUSION     Patient Active Problem List   Diagnosis Date Noted   Carpal tunnel syndrome, bilateral 07/27/2022   S/P cervical spinal fusion 07/27/2022   Lumbar degenerative disc disease 06/16/2022   BRONCHITIS, ACUTE WITH BRONCHOSPASM 03/29/2008   ASTHMA, ACUTE 03/29/2008    PCP: Pearson Grippe MD  REFERRING PROVIDER: Monica Becton, MD  REFERRING DIAG: M51.36 (ICD-10-CM) - Lumbar degenerative disc disease  Rationale for Evaluation and Treatment: Rehabilitation  THERAPY DIAG:  Other low back pain  Muscle weakness (generalized)  Abnormal posture  ONSET DATE: ~1 month ago flare up but likely multiple months  SUBJECTIVE:                                                                                                                                                                                           SUBJECTIVE STATEMENT:  Pt states she has been feeling good. Exercises continue to go well.   PERTINENT HISTORY:  Reports history of cervical fusion From eval: Pt states she saw Dr. Karie Schwalbe a month ago for her back. Pt reports she was having trouble walking. Unable to reach her feet. Pt states she was told she has a herniated disc. Pt has been doing exercises given to her by Dr T. Pt does a hot  yoga class -- every time after she notes she gets a lot of pain. Was starting to feel better with some rest (has tried to sit more throughout the day) but after going back to regular activities she hurts again.  PAIN:  Are you having pain? Yes: NPRS scale: Currently 0, at worst 9/10 Pain location: low mid back Pain description: Just a few times going down R leg, dull/ache Aggravating factors: 1.5 hours of standing, sitting long periods (  worst) Relieving factors: Rest  PRECAUTIONS: None  WEIGHT BEARING RESTRICTIONS: No  FALLS:  Has patient fallen in last 6 months? No  LIVING ENVIRONMENT: Lives with: lives with their family 2 kids Lives in: House/apartment Stairs: n/a Has following equipment at home: None  OCCUPATION: Stands all day with standing desk  PLOF: Independent  PATIENT GOALS: Improve pain with all activities  NEXT MD VISIT: n/a  OBJECTIVE: (Measures in this section from initial evaluation unless otherwise noted)  DIAGNOSTIC FINDINGS:  Lumbar x-ray 06/17/22: IMPRESSION: Negative.  PATIENT SURVEYS:  FOTO 47; predicted 62  MUSCLE LENGTH: Hamstrings: Right 80 deg; Left 70 deg Thomas test: Right -10 deg; Left 0 deg  PALPATION: Mostly TTP in lower lumbar paraspinals. Tender with Sacral Pas.   LUMBAR ROM:   AROM eval 09/16/22  Flexion 30% * 50%  Extension 80% 80%  Right lateral flexion 2" above knee * 1" above knee  Left lateral flexion 2" above knee * 1" above knee  Right rotation 90%   Left rotation 90%    (Blank rows = not tested) * = concordant pain  LOWER EXTREMITY MMT:    MMT Right eval Left eval  Hip flexion 4+ 4+  Hip extension 4+ 4  Hip abduction 4 4-  Hip adduction    Hip internal rotation    Hip external rotation    Knee flexion 3+ 3+  Knee extension 5 5  Ankle dorsiflexion    Ankle plantarflexion    Ankle inversion    Ankle eversion     (Blank rows = not tested)  UPPER EXTREMITY MMT:  MMT Right eval Left eval  Shoulder  flexion 5 4+  Shoulder extension 5 5  Shoulder abduction 5 4+  Shoulder adduction    Shoulder extension 5 5  Shoulder internal rotation 5 5  Shoulder external rotation 5 4+  Middle trapezius 5 3+  Lower trapezius    Elbow flexion    Elbow extension    Wrist flexion    Wrist extension    Wrist ulnar deviation    Wrist radial deviation    Wrist pronation    Wrist supination    Grip strength     (Blank rows = not tested)   UPPER EXTREMITY ROM:  MMT Right eval Left eval  Shoulder flexion 160 145  Shoulder extension 50 48  Shoulder abduction 165 140*  Shoulder internal rotation T3 T5  Shoulder external rotation T3 T3   (Blank rows = not tested) * = pain  CERVICAL ROM:   Active ROM A/PROM (deg) eval  Flexion 30  Extension 45  Right lateral flexion 30  Left lateral flexion 30  Right rotation 40  Left rotation 40   (Blank rows = not tested)   LUMBAR SPECIAL TESTS:  Straight leg raise test: Positive, FABER test: Negative, and Thomas test: Negative  08/31/22: Supine to long sit: L LE short to neutral Pt notes some discomfort with PA mobs L iliac crest in supine pushing into posterior rotation Forward flexion with increased R PSIS posterior rotation vs L PSIS rotation  FUNCTIONAL TESTS:  Plank: on hands and toes x 1 min 15 sec (07/15/22)  Squat: to be assessed  TODAY'S TREATMENT:     [facility] Adult PT Treatment:  DATE: 09/23/22 Therapeutic Exercise: PPT + leg lift x10 Wall squat + leg lift 2x10 Runner's hip flex/ext 2x10 R&L Forward lunge 2x10 High step up 2x5 Step downs 6" step 2x5 Self Care: Proper body mechanics with yoga movements and hiking   [facility] Adult PT Treatment:                                                DATE: 09/16/22 Therapeutic Exercise: Treadmill 1.6 mph x 5 min Sitting Hamstring stretch 3x30 sec Supine SIJ correction 5x5 sec SLR 3x10 Ab set + DL lift W09 Ab set + Leg ext + PFM contraction 2x10   Prone Anterior pelvic tilt one leg on table x30 sec Standing PPT against wall + marching + PFM 2x10 Fire hydrant red TB + PFM 2x10   [facility] Adult PT Treatment:                                                DATE: 09/07/22 Therapeutic Exercise: Supine Hamstring stretch 3x30 sec L LTR x10 Sciatic nerve glide x10 SIJ correction 5x5 sec Anterior tilt one leg on table x30 sec Ball squeeze + ab set + PFM + bridge x10 Ab set + DL lift [id number] Sitting Lumbar extension x30 sec Prone Plank x1 min Modalities: Lumbar Traction x 10, 70# pull, 60 sec rest     [facility] Adult PT Treatment:                                                DATE: 08/31/22 Therapeutic Exercise: Supine Hamstring stretch x30 sec Sciatic nerve glide x10 SIJ correction 5x5 sec Ball squeeze with ab set + PFM 10x3 sec Ball squeeze + ab set + PFM + bridge x10 Quadruped Bird dog with diaphragmatic breaths + PFM contraction x10    [facility] Adult PT Treatment:                                                DATE: 08/24/22 Therapeutic Exercise: Sidelying open/close book x10  Piriformis stretch 2x30 sec Sup ITB stretch x30 sec Bird dog yellow TB 2x10 Quadruped fire hydrant yellow TB 2x10 [place] 10x 5 sec    PATIENT EDUCATION:  Education details: Discussed SI belt if pain is coming from her PSIS Person educated: Patient Education method: Explanation, Demonstration, and Handouts Education comprehension: verbalized understanding, returned demonstration, and needs further education  HOME EXERCISE PROGRAM: Access Code: [email or web address] URL: [email or web address] Date: 07/29/2022 Prepared by: Vernon Prey April Kirstie Peri  Exercises - Prone Press Up On Elbows  - 1 x daily - 7 x weekly - 1 sets - 10 reps - 3 sec hold - Supine Bridge  - 1 x daily - 7 x weekly - 2 sets - 10 reps - 3 sec hold - Bird Dog  - 1 x daily - 7 x weekly - 1 sets - 10 reps - 5 sec hold - Sit to Stand  - 1 x daily -  7 x weekly - 2 sets - 10  reps - Prone Hamstring Curl with Anchored Resistance  - 1 x daily - 7 x weekly - 2 sets - 10 reps - 3 sec hold - Prone Hip Extension  - 1 x daily - 7 x weekly - 1 sets - 10 reps - Wall Slide with Posterior Pelvic Tilt  - 1 x daily - 7 x weekly - 2 sets - 10 reps - 3 sec hold - Standing Hip Abduction with Bent Knee  - 1 x daily - 7 x weekly - 3 sets - 10 reps - Prone W Scapular Retraction  - 1 x daily - 7 x weekly - 2 sets - 10 reps - Seated Shoulder Horizontal Abduction with Resistance  - 1 x daily - 7 x weekly - 2 sets - 10 reps - Isometric Standing Shoulder Internal Rotation - 90 Degrees Abduction  - 1 x daily - 7 x weekly - 2 sets - 10 reps  ASSESSMENT:  CLINICAL IMPRESSION: Pt continues to make good gains with pelvic stability. Reports things have been going well at home. Progressed pt to single leg activities while maintaining core and hip/pelvic alignment. Pt is getting better glute activation. Discussed POC -- pt is nearing d/c. Discussed working on today's strengthening HEP and then returning to yoga. If no issues with yoga, discussed return to cycle bar.    GOALS: Goals reviewed with patient? Yes  SHORT TERM GOALS: Target date: 08/10/2022   Pt will be ind with initial HEP Baseline: Goal status: MET  2.  Pt will have L = R SLR to demo improved hamstring extensibility for bending Baseline:  Goal status: IN PROGRESS    LONG TERM GOALS: Target date: 10/28/2022   Pt will demo pain free lumbar ROM Baseline:  09/16/22: WFL Goal status: IN PROGRESS   2.  Pt will be able to stand and sit for >2 hours with pain </=2/10 Baseline:  09/16/22 Reports improving standing and sitting with SIJ Goal status: IN PROGRESS   3.  Pt will be ind with progressing to community wellness activity/exercise  Baseline:  09/16/22 mild symptoms while using treadmill Goal status: IN PROGRESS   4.  Pt will demo safe biomechanics for lifting at least 20# for cleaning tasks Baseline:  09/16/22 did not  assess Goal status: IN PROGRESS   5.  Pt will have increased FOTO score 62 Baseline:  Goal status: IN PROGRESS  6.  Pt will be able to maintain a plank to at least 1 min to be within average norms for trunk stability Baseline:  Goal status: MET  7.  Pt will demo pain free L = R shoulder AROM for all overhead tasks Baseline:  Goal status: MET   PLAN:  PT FREQUENCY: every other week  PT DURATION: 6 weeks  PLANNED INTERVENTIONS: Therapeutic exercises, Therapeutic activity, Neuromuscular re-education, Balance training, Gait training, Patient/Family education, Self Care, Joint mobilization, Stair training, Aquatic Therapy, Dry Needling, Electrical stimulation, Spinal mobilization, Cryotherapy, Moist heat, Taping, Traction, Ionotophoresis 4mg /ml Dexamethasone, and Manual therapy.  PLAN FOR NEXT SESSION: FOTO and d/c! Assess response to HEP. Check SIJ alignment. Manual work as indicated for lumbar paraspinals. Strengthen core and hips. Work on glute and core activation in standing and with single leg weight bearing/weight shift.    Leny Morozov April Ma L Dmari Schubring, PT 09/23/2022, 2:06 PM

## 2022-10-07 ENCOUNTER — Encounter: Payer: Self-pay | Admitting: Physical Therapy

## 2022-10-07 ENCOUNTER — Ambulatory Visit: Payer: No Typology Code available for payment source | Attending: Sports Medicine | Admitting: Physical Therapy

## 2022-10-07 DIAGNOSIS — M5459 Other low back pain: Secondary | ICD-10-CM | POA: Diagnosis present

## 2022-10-07 DIAGNOSIS — M6281 Muscle weakness (generalized): Secondary | ICD-10-CM

## 2022-10-07 DIAGNOSIS — R293 Abnormal posture: Secondary | ICD-10-CM

## 2022-10-07 NOTE — Therapy (Signed)
OUTPATIENT PHYSICAL THERAPY TREATMENT   Patient Name: Sheena Yang MRN: [id number] DOB:1970-09-30, 52 y.o., female Today's Date: 10/07/2022  END OF SESSION:  PT End of Session - 10/07/22 1106     Visit Number 12    Number of Visits 16    Date for PT Re-Evaluation 10/30/22    Authorization Type [employer]    PT Start Time 1107    PT Stop Time 1145    PT Time Calculation (min) 38 min    Activity Tolerance Patient tolerated treatment well    Behavior During Therapy [facility] for tasks assessed/performed              Past Medical History:  Diagnosis Date   Asthma    Migraines    Past Surgical History:  Procedure Laterality Date   BREAST SURGERY     reduction 2009   CERVICAL FUSION     Patient Active Problem List   Diagnosis Date Noted   Carpal tunnel syndrome, bilateral 07/27/2022   S/P cervical spinal fusion 07/27/2022   Lumbar degenerative disc disease 06/16/2022   BRONCHITIS, ACUTE WITH BRONCHOSPASM 03/29/2008   ASTHMA, ACUTE 03/29/2008    PCP: Pearson Grippe MD  REFERRING PROVIDER: Monica Becton, MD  REFERRING DIAG: M51.36 (ICD-10-CM) - Lumbar degenerative disc disease  Rationale for Evaluation and Treatment: Rehabilitation  THERAPY DIAG:  Other low back pain  Muscle weakness (generalized)  Abnormal posture  ONSET DATE: ~1 month ago flare up but likely multiple months  SUBJECTIVE:                                                                                                                                                                                           SUBJECTIVE STATEMENT:  Pt reports she has been doing the bigger motion exercises. Still getting SI pain with prolonged standing. Feeling frustrated that she is not getting lasting relief.  PERTINENT HISTORY:  Reports history of cervical fusion From eval: Pt states she saw Dr. Karie Schwalbe a month ago for her back. Pt reports she was having trouble walking. Unable to reach her feet. Pt states she  was told she has a herniated disc. Pt has been doing exercises given to her by Dr T. Pt does a hot yoga class -- every time after she notes she gets a lot of pain. Was starting to feel better with some rest (has tried to sit more throughout the day) but after going back to regular activities she hurts again.  PAIN:  Are you having pain? Yes: NPRS scale: Currently 0, at worst 9/10 Pain location: low mid back Pain description: Just a few times  going down R leg, dull/ache Aggravating factors: 1.5 hours of standing, sitting long periods (worst) Relieving factors: Rest  PRECAUTIONS: None  WEIGHT BEARING RESTRICTIONS: No  FALLS:  Has patient fallen in last 6 months? No  LIVING ENVIRONMENT: Lives with: lives with their family 2 kids Lives in: House/apartment Stairs: n/a Has following equipment at home: None  OCCUPATION: Stands all day with standing desk  PLOF: Independent  PATIENT GOALS: Improve pain with all activities  NEXT MD VISIT: n/a  OBJECTIVE: (Measures in this section from initial evaluation unless otherwise noted)  DIAGNOSTIC FINDINGS:  Lumbar x-ray 06/17/22: IMPRESSION: Negative.  PATIENT SURVEYS:  FOTO 47; predicted 62  MUSCLE LENGTH: Hamstrings: Right 80 deg; Left 70 deg Thomas test: Right -10 deg; Left 0 deg  PALPATION: Mostly TTP in lower lumbar paraspinals. Tender with Sacral Pas.   LUMBAR ROM:   AROM eval 09/16/22  Flexion 30% * 50%  Extension 80% 80%  Right lateral flexion 2" above knee * 1" above knee  Left lateral flexion 2" above knee * 1" above knee  Right rotation 90%   Left rotation 90%    (Blank rows = not tested) * = concordant pain  LOWER EXTREMITY MMT:    MMT Right eval Left eval  Hip flexion 4+ 4+  Hip extension 4+ 4  Hip abduction 4 4-  Hip adduction    Hip internal rotation    Hip external rotation    Knee flexion 3+ 3+  Knee extension 5 5  Ankle dorsiflexion    Ankle plantarflexion    Ankle inversion    Ankle eversion      (Blank rows = not tested)  UPPER EXTREMITY MMT:  MMT Right eval Left eval  Shoulder flexion 5 4+  Shoulder extension 5 5  Shoulder abduction 5 4+  Shoulder adduction    Shoulder extension 5 5  Shoulder internal rotation 5 5  Shoulder external rotation 5 4+  Middle trapezius 5 3+  Lower trapezius    Elbow flexion    Elbow extension    Wrist flexion    Wrist extension    Wrist ulnar deviation    Wrist radial deviation    Wrist pronation    Wrist supination    Grip strength     (Blank rows = not tested)   UPPER EXTREMITY ROM:  MMT Right eval Left eval  Shoulder flexion 160 145  Shoulder extension 50 48  Shoulder abduction 165 140*  Shoulder internal rotation T3 T5  Shoulder external rotation T3 T3   (Blank rows = not tested) * = pain  CERVICAL ROM:   Active ROM A/PROM (deg) eval  Flexion 30  Extension 45  Right lateral flexion 30  Left lateral flexion 30  Right rotation 40  Left rotation 40   (Blank rows = not tested)   LUMBAR SPECIAL TESTS:  Straight leg raise test: Positive, FABER test: Negative, and Thomas test: Negative  08/31/22: Supine to long sit: L LE short to neutral Pt notes some discomfort with PA mobs L iliac crest in supine pushing into posterior rotation Forward flexion with increased R PSIS posterior rotation vs L PSIS rotation  FUNCTIONAL TESTS:  Plank: on hands and toes x 1 min 15 sec (07/15/22)  Squat: to be assessed  [facility] Adult PT Treatment:  DATE: 10/07/22 Therapeutic Exercise: Seated hamstring stretch x30 sec Lateral hip shift wall x10 Seated SIJ correction 5x5 Seated A/P pelvic tilt x10 Woodpecker 2x10 Forward lunge 2x10 Eccentric step down 2x10 Self Care: Compensatory sitting and standing techniques Sitting ergonomics  [facility] Adult PT Treatment:                                                DATE: 09/23/22 Therapeutic Exercise: PPT + leg lift x10 Wall squat + leg lift  2x10 Runner's hip flex/ext 2x10 R&L Forward lunge 2x10 High step up 2x5 Step downs 6" step 2x5 Self Care: Proper body mechanics with yoga movements and hiking   [facility] Adult PT Treatment:                                                DATE: 09/16/22 Therapeutic Exercise: Treadmill 1.6 mph x 5 min Sitting Hamstring stretch 3x30 sec Supine SIJ correction 5x5 sec SLR 3x10 Ab set + DL lift Z61 Ab set + Leg ext + PFM contraction 2x10  Prone Anterior pelvic tilt one leg on table x30 sec Standing PPT against wall + marching + PFM 2x10 Fire hydrant red TB + PFM 2x10    PATIENT EDUCATION:  Education details: Discussed SI belt if pain is coming from her PSIS Person educated: Patient Education method: Explanation, Demonstration, and Handouts Education comprehension: verbalized understanding, returned demonstration, and needs further education  HOME EXERCISE PROGRAM: Access Code: [email or web address] URL: [email or web address] Date: 07/29/2022 Prepared by: Vernon Prey April Kirstie Peri  Exercises - Prone Press Up On Elbows  - 1 x daily - 7 x weekly - 1 sets - 10 reps - 3 sec hold - Supine Bridge  - 1 x daily - 7 x weekly - 2 sets - 10 reps - 3 sec hold - Bird Dog  - 1 x daily - 7 x weekly - 1 sets - 10 reps - 5 sec hold - Sit to Stand  - 1 x daily - 7 x weekly - 2 sets - 10 reps - Prone Hamstring Curl with Anchored Resistance  - 1 x daily - 7 x weekly - 2 sets - 10 reps - 3 sec hold - Prone Hip Extension  - 1 x daily - 7 x weekly - 1 sets - 10 reps - Wall Slide with Posterior Pelvic Tilt  - 1 x daily - 7 x weekly - 2 sets - 10 reps - 3 sec hold - Standing Hip Abduction with Bent Knee  - 1 x daily - 7 x weekly - 3 sets - 10 reps - Prone W Scapular Retraction  - 1 x daily - 7 x weekly - 2 sets - 10 reps - Seated Shoulder Horizontal Abduction with Resistance  - 1 x daily - 7 x weekly - 2 sets - 10 reps - Isometric Standing Shoulder Internal Rotation - 90 Degrees Abduction  - 1 x daily -  7 x weekly - 2 sets - 10 reps  ASSESSMENT:  CLINICAL IMPRESSION: Pt reports some back soreness performing the larger single leg movements. Has continued SI pain with prolonged standing that gets relief with the exercises and stretching. Discussed compensatory techniques to try and help with her pain to  tolerate longer periods of standing and sitting.    GOALS: Goals reviewed with patient? Yes  SHORT TERM GOALS: Target date: 08/10/2022   Pt will be ind with initial HEP Baseline: Goal status: MET  2.  Pt will have L = R SLR to demo improved hamstring extensibility for bending Baseline:  Goal status: IN PROGRESS    LONG TERM GOALS: Target date: 10/28/2022   Pt will demo pain free lumbar ROM Baseline:  09/16/22: WFL Goal status: IN PROGRESS   2.  Pt will be able to stand and sit for >2 hours with pain </=2/10 Baseline:  09/16/22 Reports improving standing and sitting with SIJ Goal status: IN PROGRESS   3.  Pt will be ind with progressing to community wellness activity/exercise  Baseline:  09/16/22 mild symptoms while using treadmill Goal status: IN PROGRESS   4.  Pt will demo safe biomechanics for lifting at least 20# for cleaning tasks Baseline:  09/16/22 did not assess Goal status: IN PROGRESS   5.  Pt will have increased FOTO score 62 Baseline:  Goal status: IN PROGRESS  6.  Pt will be able to maintain a plank to at least 1 min to be within average norms for trunk stability Baseline:  Goal status: MET  7.  Pt will demo pain free L = R shoulder AROM for all overhead tasks Baseline:  Goal status: MET   PLAN:  PT FREQUENCY: every other week  PT DURATION: 6 weeks  PLANNED INTERVENTIONS: Therapeutic exercises, Therapeutic activity, Neuromuscular re-education, Balance training, Gait training, Patient/Family education, Self Care, Joint mobilization, Stair training, Aquatic Therapy, Dry Needling, Electrical stimulation, Spinal mobilization, Cryotherapy, Moist heat,  Taping, Traction, Ionotophoresis 4mg /ml Dexamethasone, and Manual therapy.  PLAN FOR NEXT SESSION: FOTO and d/c! Assess response to HEP. Check SIJ alignment. Manual work as indicated for lumbar paraspinals. Strengthen core and hips. Work on glute and core activation in standing and with single leg weight bearing/weight shift.    Zenith Lamphier April Ma L Nike Southwell, PT 10/07/2022, 1:16 PM

## 2022-10-12 ENCOUNTER — Other Ambulatory Visit (HOSPITAL_COMMUNITY): Payer: Self-pay

## 2022-10-12 MED ORDER — ZEPBOUND 5 MG/0.5ML ~~LOC~~ SOAJ
2.5000 mg | SUBCUTANEOUS | 2 refills | Status: AC
  Filled 2022-10-12: qty 2, 28d supply, fill #0

## 2022-10-14 ENCOUNTER — Other Ambulatory Visit: Payer: Self-pay

## 2022-10-15 ENCOUNTER — Other Ambulatory Visit (HOSPITAL_COMMUNITY): Payer: Self-pay

## 2022-10-18 ENCOUNTER — Other Ambulatory Visit (HOSPITAL_COMMUNITY): Payer: Self-pay

## 2022-10-19 ENCOUNTER — Other Ambulatory Visit (HOSPITAL_COMMUNITY): Payer: Self-pay

## 2022-10-20 ENCOUNTER — Other Ambulatory Visit (HOSPITAL_COMMUNITY): Payer: Self-pay

## 2022-11-04 ENCOUNTER — Ambulatory Visit: Payer: No Typology Code available for payment source | Admitting: Physical Therapy

## 2022-11-11 ENCOUNTER — Other Ambulatory Visit (HOSPITAL_COMMUNITY): Payer: Self-pay

## 2022-11-12 ENCOUNTER — Other Ambulatory Visit (HOSPITAL_COMMUNITY): Payer: Self-pay

## 2022-11-20 ENCOUNTER — Encounter: Payer: Self-pay | Admitting: Sports Medicine

## 2022-11-20 DIAGNOSIS — M5136 Other intervertebral disc degeneration, lumbar region: Secondary | ICD-10-CM

## 2022-11-22 MED ORDER — TRAMADOL HCL 50 MG PO TABS
50.0000 mg | ORAL_TABLET | Freq: Two times a day (BID) | ORAL | 3 refills | Status: DC | PRN
Start: 2022-11-22 — End: 2023-07-13

## 2023-01-12 ENCOUNTER — Encounter: Payer: Self-pay | Admitting: Sports Medicine

## 2023-01-17 NOTE — Telephone Encounter (Signed)
Please get her for carpal tunnel injection, as for her zolpidem, I think that needs to go through her PCP.

## 2023-01-17 NOTE — Telephone Encounter (Signed)
Attempted call to patient to assist with scheduling - left a voice mail message requesting a return call.

## 2023-01-26 ENCOUNTER — Ambulatory Visit: Payer: No Typology Code available for payment source | Admitting: Sports Medicine

## 2023-01-26 ENCOUNTER — Other Ambulatory Visit (INDEPENDENT_AMBULATORY_CARE_PROVIDER_SITE_OTHER): Payer: No Typology Code available for payment source

## 2023-01-26 DIAGNOSIS — M65342 Trigger finger, left ring finger: Secondary | ICD-10-CM | POA: Diagnosis not present

## 2023-01-26 DIAGNOSIS — G5603 Carpal tunnel syndrome, bilateral upper limbs: Secondary | ICD-10-CM | POA: Diagnosis not present

## 2023-01-26 NOTE — Assessment & Plan Note (Signed)
Recurrence of symptoms, bilateral median nerve hydrodissection today. I gave her the information on night splinting.

## 2023-01-26 NOTE — Assessment & Plan Note (Signed)
Pathophysiology discussed, palpable flexor tendon nodule, home conditioning given, carpal tunnel injection will also help, return to see me in 6 weeks.

## 2023-01-26 NOTE — Progress Notes (Signed)
    Procedures performed today:    Procedure: Real-time Ultrasound Guided hydrodissection of the right median nerve at the carpal tunnel Device: Samsung HS60 Verbal informed consent obtained.  Time-out conducted.  Noted no overlying erythema, induration, or other signs of local infection.  Skin prepped in a sterile fashion.  Local anesthesia: Topical Ethyl chloride.  With sterile technique and under real time ultrasound guidance: Noted enlarged median nerve, using a 25-gauge needle advanced into the carpal tunnel, taking care to avoid intraneural injection I injected medication both superficial to and deep to the median nerve freeing it from surrounding structures, I then redirected the needle deep and injected further medication around the flexor tendons deep within the carpal tunnel for a total of 1 cc kenalog 40, 5 cc 1% lidocaine without epinephrine. Completed without difficulty  Advised to call if fevers/chills, erythema, induration, drainage, or persistent bleeding.  Images permanently stored and available for review in PACS.  Impression: Technically successful ultrasound guided median nerve hydrodissection.  Procedure: Real-time Ultrasound Guided hydrodissection of the left median nerve at the carpal tunnel Device: Samsung HS60 Verbal informed consent obtained.  Time-out conducted.  Noted no overlying erythema, induration, or other signs of local infection.  Skin prepped in a sterile fashion.  Local anesthesia: Topical Ethyl chloride.  With sterile technique and under real time ultrasound guidance: Noted enlarged median nerve, using a 25-gauge needle advanced into the carpal tunnel, taking care to avoid intraneural injection I injected medication both superficial to and deep to the median nerve freeing it from surrounding structures, I then redirected the needle deep and injected further medication around the flexor tendons deep within the carpal tunnel for a total of 1 cc kenalog 40,  5 cc 1% lidocaine without epinephrine. Completed without difficulty  Advised to call if fevers/chills, erythema, induration, drainage, or persistent bleeding.  Images permanently stored and available for review in PACS.  Impression: Technically successful ultrasound guided median nerve hydrodissection.  Independent interpretation of notes and tests performed by another provider:   None.  Brief History, Exam, Impression, and Recommendations:    Carpal tunnel syndrome, bilateral Recurrence of symptoms, bilateral median nerve hydrodissection today. I gave her the information on night splinting.  Acquired trigger finger of left ring finger Pathophysiology discussed, palpable flexor tendon nodule, home conditioning given, carpal tunnel injection will also help, return to see me in 6 weeks.    ____________________________________________ Sheena Yang. Sheena Yang, M.D., ABFM., CAQSM., AME. Primary Care and Sports Medicine [facility]  Adjunct Professor of Family Medicine  [employer]  Restaurant manager, fast food

## 2023-03-09 ENCOUNTER — Ambulatory Visit: Payer: No Typology Code available for payment source | Admitting: Sports Medicine

## 2023-03-09 ENCOUNTER — Encounter: Payer: Self-pay | Admitting: Sports Medicine

## 2023-03-09 DIAGNOSIS — M65342 Trigger finger, left ring finger: Secondary | ICD-10-CM

## 2023-03-09 DIAGNOSIS — G5603 Carpal tunnel syndrome, bilateral upper limbs: Secondary | ICD-10-CM

## 2023-03-09 NOTE — Progress Notes (Signed)
    Procedures performed today:    None.  Independent interpretation of notes and tests performed by another provider:   None.  Brief History, Exam, Impression, and Recommendations:    Acquired trigger finger of left ring finger Luckily her flexor trigger finger left ring has resolved after her carpal tunnel injection. She will continue her home conditioning indefinitely.  Carpal tunnel syndrome, bilateral We did bilateral median nerve hydrodissections approximately 6 weeks ago, her symptoms are resolved, she will continue intermittent nighttime splinting, home conditioning, return as needed.    ____________________________________________ Ihor Austin. Benjamin Stain, M.D., ABFM., CAQSM., AME. Primary Care and Sports Medicine [facility]  Adjunct Professor of Family Medicine  [employer] [place] [employer]  Restaurant manager, fast food

## 2023-03-09 NOTE — Assessment & Plan Note (Signed)
Luckily her flexor trigger finger left ring has resolved after her carpal tunnel injection. She will continue her home conditioning indefinitely.

## 2023-03-09 NOTE — Assessment & Plan Note (Signed)
We did bilateral median nerve hydrodissections approximately 6 weeks ago, her symptoms are resolved, she will continue intermittent nighttime splinting, home conditioning, return as needed.

## 2023-07-12 ENCOUNTER — Other Ambulatory Visit: Payer: Self-pay | Admitting: Sports Medicine

## 2023-07-12 DIAGNOSIS — M51369 Other intervertebral disc degeneration, lumbar region without mention of lumbar back pain or lower extremity pain: Secondary | ICD-10-CM

## 2023-11-23 IMAGING — CT CT CARDIAC CORONARY ARTERY CALCIUM SCORE
3 series · 13 of 20 positions shown, 15 images · non-contrast
Comparison: None.

CLINICAL DATA: Pure hypercholesterolemia

EXAM:
CT CARDIAC CORONARY ARTERY CALCIUM SCORE
TECHNIQUE: Non-contrast imaging through the heart was performed using
prospective ECG gating. Image post processing was performed on an
independent workstation, allowing for quantitative analysis of the
heart and coronary arteries. Note that this exam targets the heart
and the chest was not imaged in its entirety.

[Series 2: calcium scoring 2.00 qr36 bestdiast 69% hrt calciu · axial · 0.35mm/px · z∈[+1502,+1550]mm · 3 of 60 slices shown]
[im 12/60  vessel]
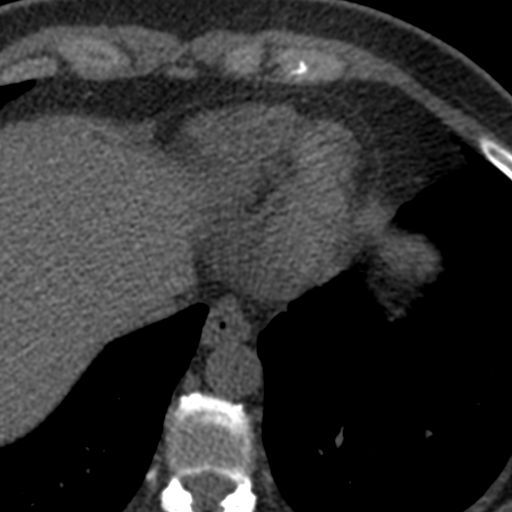
[im 24/60  vessel]
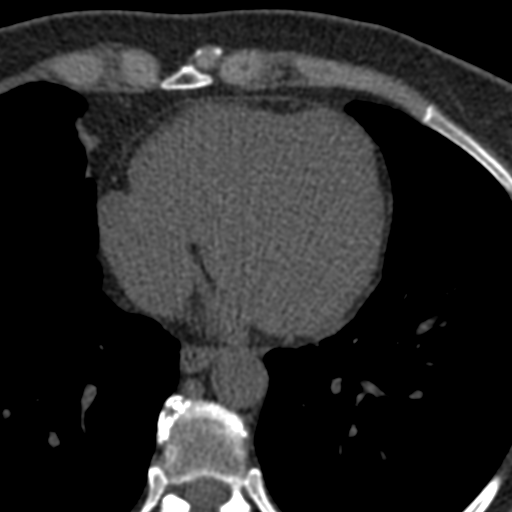
[im 36/60  vessel]
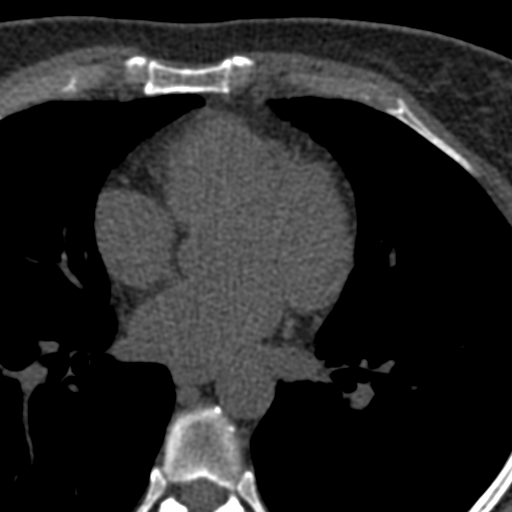

[Series 3: calcium scoring 2.00 br40 bestdiast 69% axial · axial · 0.50mm/px · z∈[+1498,+1578]mm · 5 of 60 slices shown, 7 images]
[im 10/60  vessel]
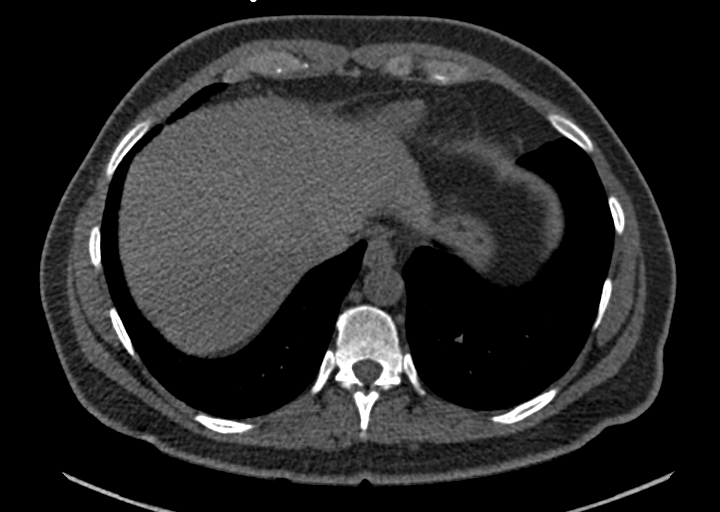
[im 10/60  lung]
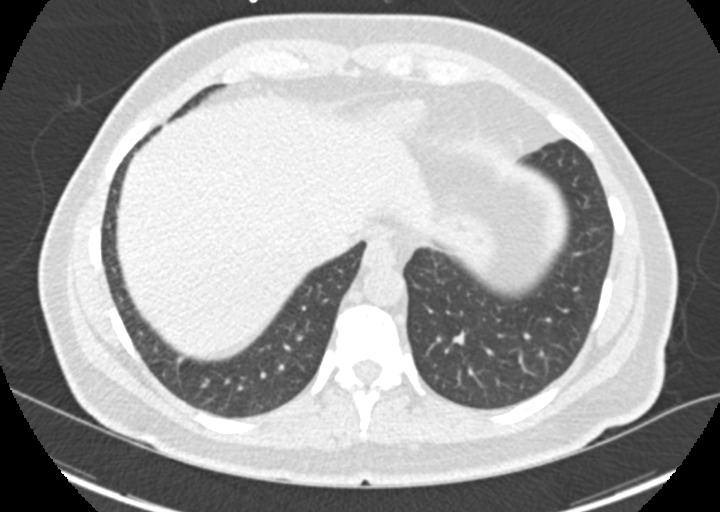
[im 20/60  vessel]
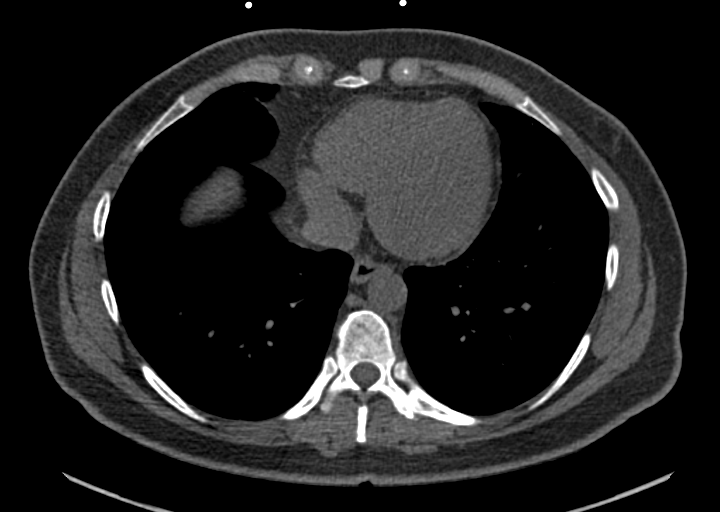
[im 30/60  vessel]
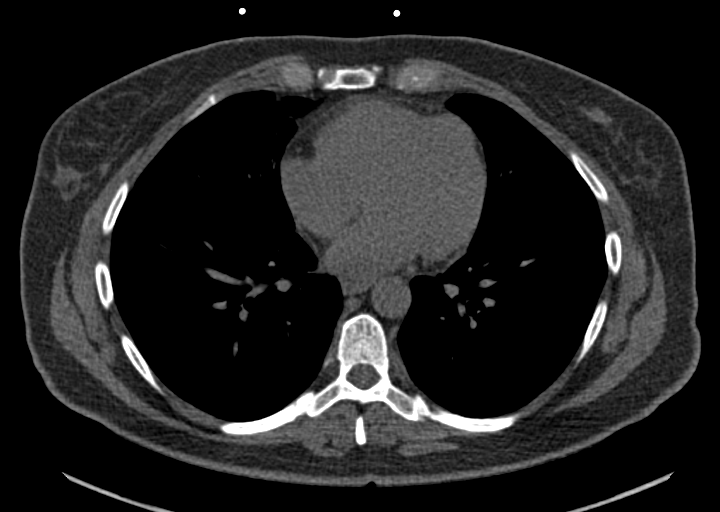
[im 40/60  vessel]
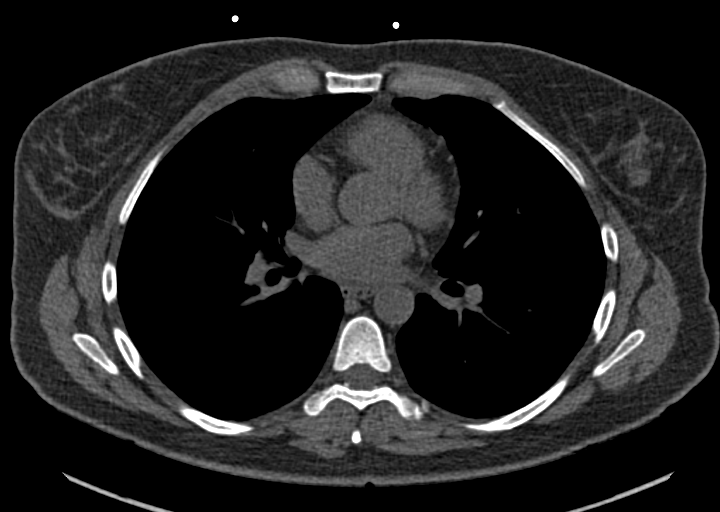
[im 50/60  vessel]
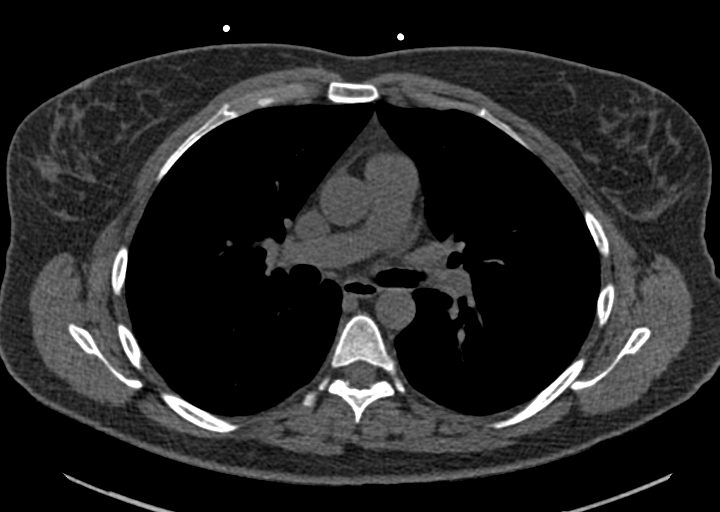
[im 50/60  lung]
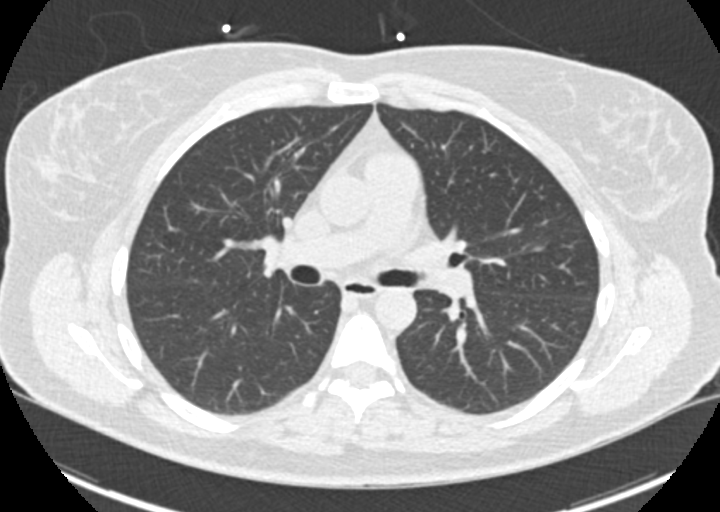

[Series 9: calcium scoring 2.00 br60 bestdiast 69% lungs · axial · 0.50mm/px · z∈[+1498,+1578]mm · 5 of 60 slices shown]
[im 10/60  vessel]
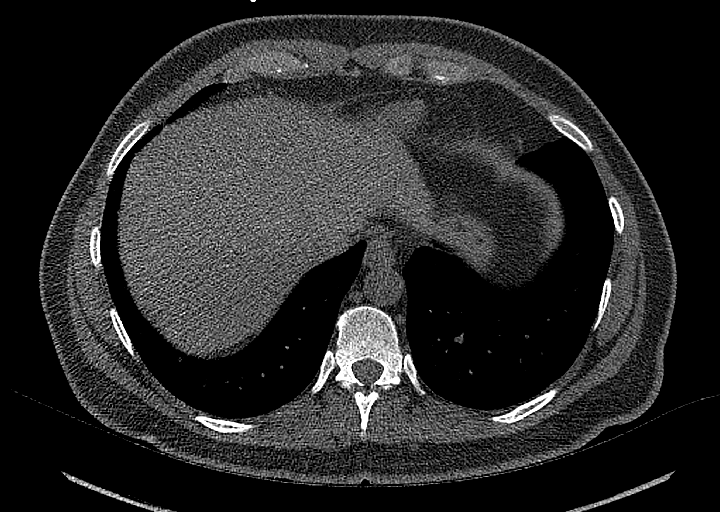
[im 20/60  vessel]
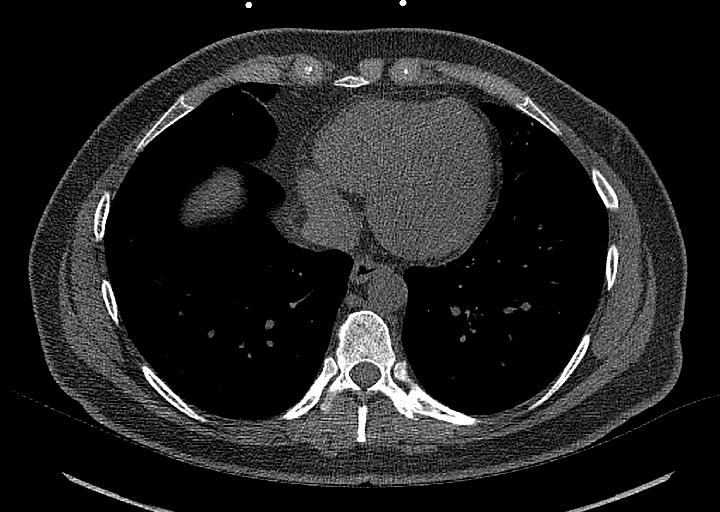
[im 30/60  vessel]
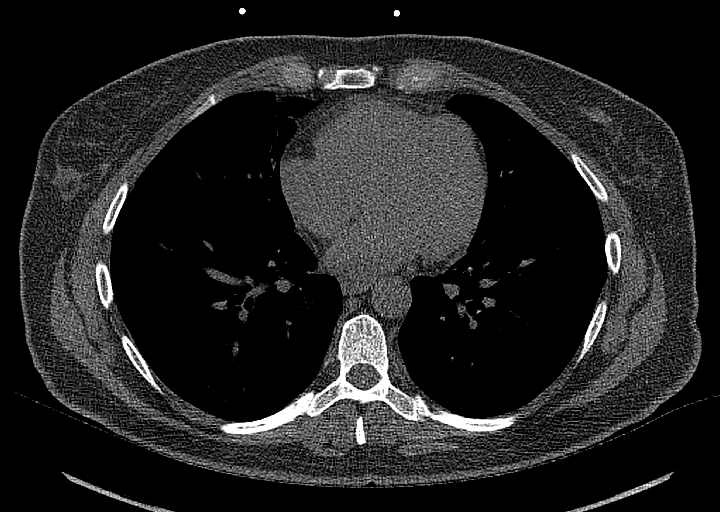
[im 40/60  vessel]
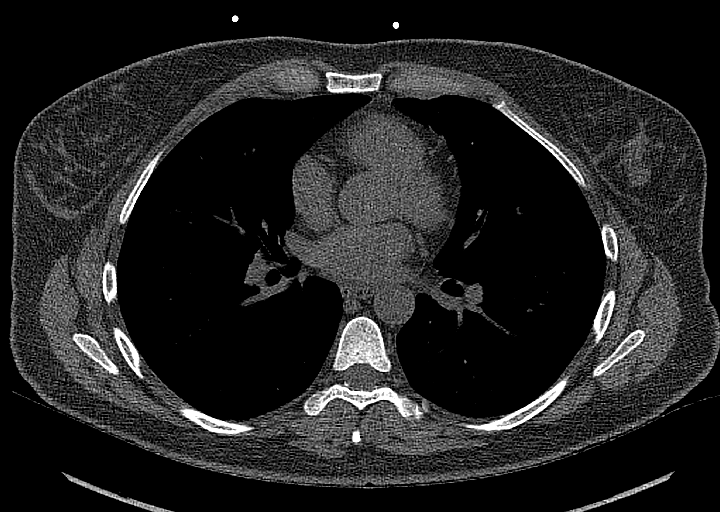
[im 50/60  vessel]
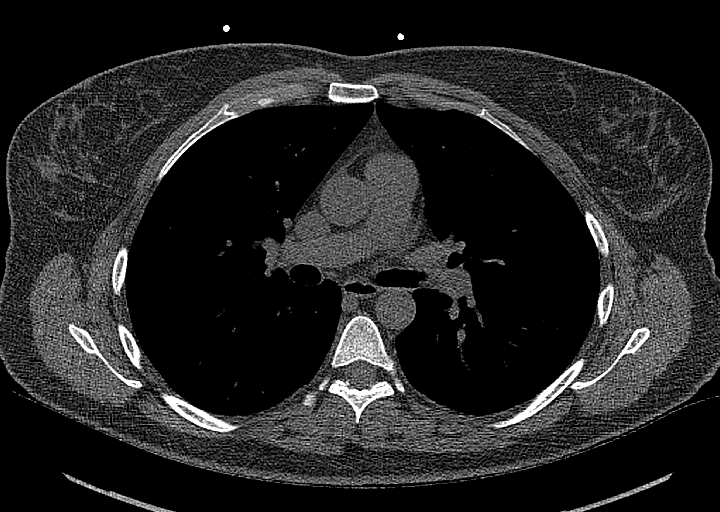

[13 of 20 positions shown; findings below may reference images not displayed]

FINDINGS: CORONARY CALCIUM SCORES:

Left Main: 0

LAD: 0

LCx: 0

RCA: 0

Total Agatston Score: 0

[HOSPITAL] percentile: 0

AORTA MEASUREMENTS:

Ascending Aorta: 23 mm

Descending Aorta: 19 mm

OTHER FINDINGS:

Heart is normal size. Aorta normal caliber. Scarring in the right
middle lobe and lingula anteriorly. No acute confluent airspace
opacities. No effusions. No adenopathy. No acute findings in the
upper abdomen. Chest wall soft tissues are unremarkable. No acute
bony abnormality.
IMPRESSION: No visible coronary artery calcifications. Total coronary calcium
score of 0.

Scarring in the lung bases.

No acute extra cardiac abnormality.

## 2024-02-07 ENCOUNTER — Encounter: Payer: Self-pay | Admitting: Sports Medicine

## 2024-03-28 ENCOUNTER — Telehealth: Payer: Self-pay

## 2024-03-28 NOTE — Telephone Encounter (Signed)
 Copied from CRM #[id number]. Topic: Clinical - Medication Question >> Mar 28, 2024  2:26 PM Selinda RAMAN wrote: Reason for CRM: The patient called in stating her pharmacy said they denied her refill on her traMADol  (ULTRAM ) 50 MG tablet because Dr T is no longer with the practice. She said she needs this for her back pain. I spoke with Lorenza and the providers are reviewing it and will get back with the patient. She says she only has 4 pills left and it really helps and she doesn't know what else to do. Please assist patient further.

## 2024-03-28 NOTE — Telephone Encounter (Signed)
 Patient scheduled with Sports Medicine in [facility]. Patient is aware and given address.    [address] [id number], [place], [place] Phone: [phone]

## 2024-03-29 ENCOUNTER — Ambulatory Visit

## 2024-03-29 VITALS — BP 94/70 | Ht 64.0 in | Wt 138.0 lb

## 2024-03-29 DIAGNOSIS — M4712 Other spondylosis with myelopathy, cervical region: Secondary | ICD-10-CM

## 2024-03-29 DIAGNOSIS — M545 Low back pain, unspecified: Secondary | ICD-10-CM | POA: Diagnosis not present

## 2024-03-29 DIAGNOSIS — R200 Anesthesia of skin: Secondary | ICD-10-CM

## 2024-03-29 DIAGNOSIS — Z981 Arthrodesis status: Secondary | ICD-10-CM

## 2024-03-29 DIAGNOSIS — R202 Paresthesia of skin: Secondary | ICD-10-CM

## 2024-03-29 MED ORDER — NAPROXEN 500 MG PO TABS: 500.0000 mg | ORAL_TABLET | Freq: Two times a day (BID) | ORAL | 1 refills | Status: AC

## 2024-03-29 NOTE — Progress Notes (Signed)
 Subjective:    Patient ID: Sheena Yang, female    DOB: 53 y.o., 12/30/1970   MRN: [id number]  Chief Complaint: Lumbar radiculopathy  Discussed the use of AI scribe software for clinical note transcription with the patient, who gave verbal consent to proceed.  History of Present Illness Sheena Yang is a very pleasant 53 year old female presenting for follow-up on back/leg pain and sleep disturbance.  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  Lower extremity pain and restlessness - Significant pain primarily in the lower legs, worsening at night - Pain disrupts sleep and is associated with a strong urge to move the legs when not taking tramadol  - Tramadol  at night alleviates pain and prevents sleep disturbances - Attempts to discontinue tramadol  at night result in intolerable leg pain - No nocturnal cramping reported  Paresthesia and sensory disturbances - Numbness and tingling in the feet, especially when legs are elevated or during physical activity such as running - Feet pretty consistently will 'fall asleep' while running on a treadmill at the gym  Lower back and hip pain - Pain in the lower back and hips, particularly in the morning - Occasional use of tramadol  during the day for back pain  Functional status - 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  TTP along the left SI joint. -AROM/PROM: FROM in all planes of the low back -Strength: full hip flexion (L1/L2), knee extension (L3/4), ankle dorsiflexion (L4/5), hip extension (L5/S1), knee  flexion (L5/S1/S2) plantarflexion (S1/2). -Sensation: intact sensation over the medial femoral condyle (L3), patella (L4), lateral femoral condyle (L5), lateral malleolus (S1). -Reflexes: normal patellar (L3/4), hamstring (L5/S1), achilles (S1/2) reflexes, equal bilaterally -Special tests: - Straight Leg Raise (though this maneuver elicits a numbness encompassing the entire foot [R>L]), - Stork, - Slump test  Left Hip (compared to normal) -Inspection: No swelling or skin changes. No leg length discrepancy. No gait abnormalities with walking. -Palpation: TTP - level ASIS, - level AIIS, - adductor, + greater trochanter, - SI joint, - over piriformis -AROM/PROM: Flexion 120 deg, abduction 50 deg, adduction 25 deg, extension 15 deg, ER 50 deg, IR 35 deg, good hamstring flexibility -Strength: 5/5 flexion, 5/5 abduction, 5/5 adduction, 5/5 extension, -Special tests: -  FADIR, -  log roll, - Thomas, - Ober, - Noble, - FABER, equivocal piriformis testing, - SLR (though produces the symptoms above), - hop test  Right Hip (compared to normal) -Inspection: No swelling or skin changes. No- leg length discrepancy. No gait abnormalities with walking. -Palpation: TTP - level ASIS, - level AIIS, - adductor, - greater trochanter, + SI joint, - over piriformis -AROM/PROM: Flexion 120 deg, abduction  50 deg, adduction 25 deg, extension  15 deg, ER 60 deg, IR 25 deg, good hamstring flexibility -Strength: 5/5 flexion, 5/5 abduction, 5/5 adduction, 5/5 extension, -Special tests: -  FADIR, -  log roll, [place] Ned, - Ober, - Noble, - FABER, equivocal piriformis testing, - SLR (though produces the symptoms above), - hop test       Assessment & Plan:   Assessment & Plan Sheena Yang  is a pleasant 53 year old female presenting with a peculiar pattern with pain in her low back that seems to have some relation to periodic pain in her lower extremities as well.  MRI imaging from 07/2022 did not show significant spinal stenosis  or root encroachment, however it did show some disc protrusion at L4-5, narrowing of the foramen/lateral recess at L5-S1, and a conjoined right L5-S1 nerve root.  Spinal stenosis not seen on imaging and worsening of symptoms with hip flexion also is at odds with that diagnosis.  Nocturnal predominance of this pain and insatiable need to get up supports restless leg syndrome though no apparent signs of iron deficiency on last bloodwork (06/2019 Hbg of 14.1, MCV 91).  Pain in lower back may be related to intrinsic spinal problems, but pain in thighs, lower legs, and occasional numbness/tingling in feet seemingly worsened with prolonged sitting or flexion at the hip befuddles me.  Unsure if foot numbness experienced with running is related though chronic exertional compartment syndrome and popliteal artery entrapment syndrome are on the differential.  Differential otherwise includes lumbar radiculopathy, piriformis syndrome, dynamic vascular compression. Regardless, I discussed the risks of long-term tramadol  use and recommend instead trialing a short NSAID course. I will discuss with some colleagues prior to deciding if NCS/EMG, vascular imaging, MRI, repeat labwork, or other intervention would be next most appropriate step.  Cervical spondylosis with myelopathy, status post C5-C6 fusion   Cervical spondylosis with myelopathy, status post C5-C6 fusion, presents with no current symptoms related to the cervical spine.

## 2024-04-02 ENCOUNTER — Other Ambulatory Visit: Payer: Self-pay

## 2024-04-02 DIAGNOSIS — G2581 Restless legs syndrome: Secondary | ICD-10-CM

## 2024-04-02 MED ORDER — PREGABALIN 50 MG PO CAPS: 50.0000 mg | ORAL_CAPSULE | Freq: Every evening | ORAL | 0 refills | Status: AC
